# Patient Record
Sex: Male | Born: 1950
Health system: Southern US, Community
[De-identification: ages and names within clinical notes are randomized; demographics above are authoritative.]

## PROBLEM LIST (undated history)

## (undated) ENCOUNTER — Emergency Department (HOSPITAL_COMMUNITY): Payer: Medicare Other

## (undated) DIAGNOSIS — E785 Hyperlipidemia, unspecified: Secondary | ICD-10-CM

## (undated) DIAGNOSIS — I313 Pericardial effusion (noninflammatory): Secondary | ICD-10-CM

## (undated) DIAGNOSIS — I10 Essential (primary) hypertension: Secondary | ICD-10-CM

## (undated) DIAGNOSIS — I3139 Other pericardial effusion (noninflammatory): Secondary | ICD-10-CM

## (undated) DIAGNOSIS — J439 Emphysema, unspecified: Secondary | ICD-10-CM

## (undated) DIAGNOSIS — M722 Plantar fascial fibromatosis: Secondary | ICD-10-CM

## (undated) DIAGNOSIS — K056 Periodontal disease, unspecified: Secondary | ICD-10-CM

## (undated) HISTORY — DX: Essential (primary) hypertension: I10

## (undated) HISTORY — DX: Plantar fascial fibromatosis: M72.2

## (undated) HISTORY — DX: Hyperlipidemia, unspecified: E78.5

## (undated) HISTORY — PX: BACK SURGERY: SHX140

## (undated) HISTORY — DX: Periodontal disease, unspecified: K05.6

## (undated) HISTORY — DX: Pericardial effusion (noninflammatory): I31.3

## (undated) HISTORY — DX: Other pericardial effusion (noninflammatory): I31.39

---

## 2003-04-23 ENCOUNTER — Emergency Department (HOSPITAL_COMMUNITY): Admission: EM | Admit: 2003-04-23 | Discharge: 2003-04-23 | Payer: Self-pay | Admitting: Emergency Medicine

## 2003-04-23 ENCOUNTER — Encounter: Payer: Self-pay | Admitting: Emergency Medicine

## 2004-06-11 ENCOUNTER — Emergency Department (HOSPITAL_COMMUNITY): Admission: EM | Admit: 2004-06-11 | Discharge: 2004-06-11 | Payer: Self-pay | Admitting: Emergency Medicine

## 2006-03-09 ENCOUNTER — Encounter: Admission: RE | Admit: 2006-03-09 | Discharge: 2006-03-09 | Payer: Self-pay | Admitting: Family Medicine

## 2006-07-23 ENCOUNTER — Emergency Department (HOSPITAL_COMMUNITY): Admission: EM | Admit: 2006-07-23 | Discharge: 2006-07-24 | Payer: Self-pay | Admitting: Emergency Medicine

## 2006-08-14 ENCOUNTER — Encounter (INDEPENDENT_AMBULATORY_CARE_PROVIDER_SITE_OTHER): Payer: Self-pay | Admitting: Specialist

## 2006-08-14 ENCOUNTER — Ambulatory Visit (HOSPITAL_COMMUNITY): Admission: RE | Admit: 2006-08-14 | Discharge: 2006-08-14 | Payer: Self-pay | Admitting: Gastroenterology

## 2007-11-22 ENCOUNTER — Emergency Department (HOSPITAL_COMMUNITY): Admission: EM | Admit: 2007-11-22 | Discharge: 2007-11-22 | Payer: Self-pay | Admitting: Emergency Medicine

## 2008-04-12 ENCOUNTER — Emergency Department (HOSPITAL_COMMUNITY): Admission: EM | Admit: 2008-04-12 | Discharge: 2008-04-12 | Payer: Self-pay | Admitting: Emergency Medicine

## 2008-04-17 ENCOUNTER — Encounter: Admission: RE | Admit: 2008-04-17 | Discharge: 2008-04-17 | Payer: Self-pay | Admitting: Specialist

## 2008-04-20 ENCOUNTER — Encounter: Admission: RE | Admit: 2008-04-20 | Discharge: 2008-04-20 | Payer: Self-pay | Admitting: Specialist

## 2010-07-23 ENCOUNTER — Ambulatory Visit: Payer: Self-pay | Admitting: Cardiovascular Disease

## 2011-01-15 ENCOUNTER — Encounter: Payer: Self-pay | Admitting: Cardiovascular Disease

## 2011-01-15 DIAGNOSIS — E785 Hyperlipidemia, unspecified: Secondary | ICD-10-CM | POA: Insufficient documentation

## 2011-01-15 DIAGNOSIS — I10 Essential (primary) hypertension: Secondary | ICD-10-CM | POA: Insufficient documentation

## 2011-01-15 DIAGNOSIS — I313 Pericardial effusion (noninflammatory): Secondary | ICD-10-CM | POA: Insufficient documentation

## 2011-01-21 ENCOUNTER — Ambulatory Visit: Payer: Self-pay | Admitting: Cardiovascular Disease

## 2011-02-10 ENCOUNTER — Other Ambulatory Visit: Payer: Self-pay | Admitting: *Deleted

## 2011-02-10 DIAGNOSIS — E7800 Pure hypercholesterolemia, unspecified: Secondary | ICD-10-CM

## 2011-02-11 ENCOUNTER — Other Ambulatory Visit (INDEPENDENT_AMBULATORY_CARE_PROVIDER_SITE_OTHER): Payer: 59 | Admitting: *Deleted

## 2011-02-11 ENCOUNTER — Ambulatory Visit (INDEPENDENT_AMBULATORY_CARE_PROVIDER_SITE_OTHER): Payer: 59 | Admitting: Cardiovascular Disease

## 2011-02-11 ENCOUNTER — Encounter: Payer: Self-pay | Admitting: Cardiovascular Disease

## 2011-02-11 VITALS — BP 112/84 | HR 80 | Wt 227.0 lb

## 2011-02-11 DIAGNOSIS — E7800 Pure hypercholesterolemia, unspecified: Secondary | ICD-10-CM

## 2011-02-11 DIAGNOSIS — E785 Hyperlipidemia, unspecified: Secondary | ICD-10-CM

## 2011-02-11 DIAGNOSIS — I10 Essential (primary) hypertension: Secondary | ICD-10-CM

## 2011-02-11 LAB — BASIC METABOLIC PANEL
Calcium: 9.6 mg/dL (ref 8.4–10.5)
Creatinine, Ser: 0.9 mg/dL (ref 0.4–1.5)
GFR: 98.6 mL/min (ref >60.00)
Sodium: 139 mEq/L (ref 135–145)

## 2011-02-11 LAB — LIPID PANEL
HDL: 34.3 mg/dL — ABNORMAL LOW (ref >39.00)
Total CHOL/HDL Ratio: 4
Triglycerides: 77 mg/dL (ref 0.0–149.0)
VLDL: 15.4 mg/dL (ref 0.0–40.0)

## 2011-02-11 LAB — HEPATIC FUNCTION PANEL
Albumin: 4.4 g/dL (ref 3.5–5.2)
Alkaline Phosphatase: 67 U/L (ref 39–117)
Bilirubin, Direct: 0.1 mg/dL (ref 0.0–0.3)
Total Protein: 7.4 g/dL (ref 6.0–8.3)

## 2011-02-11 NOTE — Assessment & Plan Note (Signed)
His blood pressure is well controlled. We'll continue with his same medications. I've encouraged him to get out and exercise on a regular basis.

## 2011-02-11 NOTE — Assessment & Plan Note (Signed)
He had some questions about the Lovaza. I told him that the laser was a very pure fish oil ca[psule. He will continue to take it as long as his working as well as it is.

## 2011-02-11 NOTE — Patient Instructions (Signed)
priimary MD - Larita Fife

## 2011-02-11 NOTE — Progress Notes (Signed)
Larene Pickett Date of Birth  1951-10-02 [facility] / [facility] [address]     [address] [place], [place]  [address] [phone]  Fax  [phone]  History of Present Illness:  Demico is a middle-aged gentleman with a history of hypertension, hyperlipidemia, and a pericardial effusion. He's done very well since I last saw him. He's not had any episodes of chest pain or shortness of breath.  Current Outpatient Prescriptions on File Prior to Visit  Medication Sig Dispense Refill  . ezetimibe-simvastatin (VYTORIN) 10-40 MG per tablet Take 1 tablet by mouth at bedtime.        Marland Kitchen lisinopril-hydrochlorothiazide (PRINZIDE,ZESTORETIC) 20-25 MG per tablet Take 1 tablet by mouth daily.        Marland Kitchen omega-3 acid ethyl esters (LOVAZA) 1 G capsule Take 2 g by mouth 2 (two) times daily.          Allergies  Allergen Reactions  . Lipitor (Atorvastatin Calcium) Other (See Comments)    MUSCLE ACHES, GI UPSET  . Niaspan (Niacin (Antihyperlipidemic)) Itching and Other (See Comments)    FLUSHING  . Penicillins Hives    Past Medical History  Diagnosis Date  . Pericardial effusion   . HTN (hypertension)   . Hyperlipemia   . Plantar fasciitis   . Periodontal disease     Past Surgical History  Procedure Date  . Back surgery     L4,L5    History  Smoking status  . Current Everyday Smoker -- 40 years  . Types: Cigarettes  Smokeless tobacco  . Not on file    History  Alcohol Use  . Yes    OCCASIONAL BEER    Family History  Problem Relation Age of Onset  . Cancer Father   . Hypertension Father   . Coronary artery disease Mother   . Cancer Mother     GALLBLADDER  . Hypertension Brother     Reviw of Systems:  Reviewed in the HPI.  All other systems are negative.  Physical Exam: BP 140/88  Pulse 80  Wt 227 lb (102.967 kg) The patient is alert and oriented x 3.  The mood and affect are normal.  The skin is warm and dry.  Color is normal.  The  HEENT exam reveals that the sclera are nonicteric.  The mucous membranes are moist.  The carotids are 2+ without bruits.  There is no thyromegaly.  There is no JVD.  The lungs are clear.  The chest wall is non tender.  The heart exam reveals a regular rate with a normal S1 and S2.  There are no murmurs, gallops, or rubs.  The PMI is not displaced.   Abdominal exam reveals good bowel sounds.  There is no guarding or rebound.  There is no hepatosplenomegaly or tenderness.  There are no masses.  Exam of the legs reveal no clubbing, cyanosis, or edema.  The legs are without rashes.  The distal pulses are intact.  Cranial nerves II - XII are intact.  Motor and sensory functions are intact.  The gait is normal.  Assessment / Plan:

## 2011-02-12 ENCOUNTER — Telehealth: Payer: Self-pay | Admitting: *Deleted

## 2011-02-12 NOTE — Telephone Encounter (Signed)
msg left to phone back with questions, cholesterol results left.Alfonso Ramus RN

## 2011-03-14 NOTE — Op Note (Signed)
NAME:  ALYUS, MOFIELD NO.:  [id number]   MEDICAL RECORD NO.:  [id number]          PATIENT TYPE:  AMB   LOCATION:  ENDO                         FACILITY:  [facility]   PHYSICIAN:  Anselmo Rod, M.D.  DATE OF BIRTH:  06-Dec-1950   DATE OF PROCEDURE:  08/14/2006  DATE OF DISCHARGE:  08/14/2006                                 OPERATIVE REPORT   PROCEDURE PERFORMED:  Colonoscopy with snare polypectomy x1.   ENDOSCOPIST:  Anselmo Rod, M.D.   INSTRUMENT USED:  Olympus video colonoscope.   INDICATIONS FOR PROCEDURE:  A 60 year old white male with a family history  of colon cancer in his father undergoing a screening colonoscopy.  Rule out  colonic polyps, masses, etc.  He had some left lower quadrant pain.   PREPROCEDURE PREPARATION:  Informed consent was procured from the patient.  The patient fasted for four hours prior to the procedure and prepped with 20  Osmoprep pills the night of and 12 Osmoprep pills the morning of the  procedure.  Risks and benefits of the procedure including a 10% miss rate of  cancer and polyps were discussed with the patient as well.   PREPROCEDURE PHYSICAL:  VITAL SIGNS:  Stable vital signs.  NECK:  Supple.  CHEST:  Clear to auscultation.  CARDIOVASCULAR:  S1 and S2 regular.  ABDOMEN:  Soft with normal bowel sounds.   DESCRIPTION OF PROCEDURE:  The patient was placed in left lateral decubitus  position, sedated with 100 mcg of fentanyl and 10 mg of Versed in slow  incremental doses.  Once the patient was adequately sedated and maintained  on low flow oxygen and continuous cardiac monitoring, the Olympus video  colonoscope was advanced from the rectum to the cecum.  The appendiceal  orifice and ileocecal valve were clearly visualized and photographed.  A  small sessile polyp was snared from the distal right colon (snare  polypectomy x1).  The rest of the exam was unremarkable.  Small internal  hemorrhoids were seen on retroflexion.   There was no evidence of  diverticulosis.   IMPRESSION:  1. Small nonbleeding internal hemorrhoids.  2. Small sessile polyps snared from the distal right colon.  3. Otherwise normal exam to the terminal ileum.  4. No evidence of diverticulosis.   RECOMMENDATIONS:  1. Await pathology results.  2. Repeat colonoscopy depending on pathology results.  3. Avoid all nonsteroidals including aspirin for the next four weeks.  4. Outpatient follow-up as need arises in the future.  If the patient has      persistent left lower quadrant pain, he should return to the office in      the next two weeks for follow-up.      Anselmo Rod, M.D.  Electronically Signed     JNM/MEDQ  D:  08/14/2006  T:  08/16/2006  Job:  [id number]   cc:   Talmadge Coventry, M.D.

## 2011-06-11 ENCOUNTER — Other Ambulatory Visit: Payer: Self-pay | Admitting: Cardiovascular Disease

## 2011-06-11 MED ORDER — OMEGA-3-ACID ETHYL ESTERS 1 G PO CAPS: 2.0000 g | ORAL_CAPSULE | Freq: Two times a day (BID) | ORAL | Status: DC

## 2011-06-11 MED ORDER — EZETIMIBE-SIMVASTATIN 10-40 MG PO TABS: 1.0000 | ORAL_TABLET | Freq: Every day | ORAL | Status: DC

## 2011-08-26 ENCOUNTER — Ambulatory Visit (INDEPENDENT_AMBULATORY_CARE_PROVIDER_SITE_OTHER): Payer: 59 | Admitting: Cardiovascular Disease

## 2011-08-26 ENCOUNTER — Encounter: Payer: Self-pay | Admitting: Cardiovascular Disease

## 2011-08-26 ENCOUNTER — Other Ambulatory Visit (INDEPENDENT_AMBULATORY_CARE_PROVIDER_SITE_OTHER): Payer: BLUE CROSS/BLUE SHIELD | Admitting: *Deleted

## 2011-08-26 DIAGNOSIS — E785 Hyperlipidemia, unspecified: Secondary | ICD-10-CM

## 2011-08-26 DIAGNOSIS — J329 Chronic sinusitis, unspecified: Secondary | ICD-10-CM | POA: Insufficient documentation

## 2011-08-26 LAB — HEPATIC FUNCTION PANEL
ALT: 17 U/L (ref 0–53)
AST: 19 U/L (ref 0–37)
Alkaline Phosphatase: 59 U/L (ref 39–117)
Total Bilirubin: 0.4 mg/dL (ref 0.3–1.2)

## 2011-08-26 LAB — BASIC METABOLIC PANEL
BUN: 10 mg/dL (ref 6–23)
Chloride: 106 mEq/L (ref 96–112)
GFR: 105.54 mL/min (ref >60.00)
Potassium: 3.9 mEq/L (ref 3.5–5.1)
Sodium: 139 mEq/L (ref 135–145)

## 2011-08-26 LAB — LIPID PANEL
Cholesterol: 125 mg/dL (ref 0–200)
LDL Cholesterol: 76 mg/dL (ref 0–99)
VLDL: 13.6 mg/dL (ref 0.0–40.0)

## 2011-08-26 NOTE — Progress Notes (Signed)
Larene Pickett Date of Birth  [birth year] [facility] [address]    [address] [place], [place]  [address] [phone]  Fax  [phone]  History of Present Illness:  No cardiac complaints.  Complains of chronic sinus drainage.  He has broken his nose several times. He continues to smoke.  Has a history of hyperlipidemia. He's on Zetia.  Current Outpatient Prescriptions on File Prior to Visit  Medication Sig Dispense Refill  . ezetimibe-simvastatin (VYTORIN) 10-40 MG per tablet Take 1 tablet by mouth at bedtime.  30 tablet  3  . lisinopril-hydrochlorothiazide (PRINZIDE,ZESTORETIC) 20-25 MG per tablet Take 1 tablet by mouth daily.        Marland Kitchen omega-3 acid ethyl esters (LOVAZA) 1 G capsule Take 2 capsules (2 g total) by mouth 2 (two) times daily.  120 capsule  3    Allergies  Allergen Reactions  . Lipitor (Atorvastatin Calcium) Other (See Comments)    MUSCLE ACHES, GI UPSET  . Niaspan (Niacin (Antihyperlipidemic)) Itching and Other (See Comments)    FLUSHING  . Penicillins Hives    Past Medical History  Diagnosis Date  . Pericardial effusion   . HTN (hypertension)   . Hyperlipemia   . Plantar fasciitis   . Periodontal disease     Past Surgical History  Procedure Date  . Back surgery     L4,L5    History  Smoking status  . Current Everyday Smoker -- 40 years  . Types: Cigarettes  Smokeless tobacco  . Not on file    History  Alcohol Use  . Yes    OCCASIONAL BEER    Family History  Problem Relation Age of Onset  . Cancer Father   . Hypertension Father   . Coronary artery disease Mother   . Cancer Mother     GALLBLADDER  . Hypertension Brother     Reviw of Systems:  Reviewed in the HPI.  All other systems are negative.  Physical Exam:a BP 135/85  Pulse 72  Ht 6\' 2"  (1.88 m)  Wt 231 lb 12.8 oz (105.144 kg)  BMI 29.76 kg/m2 The patient is alert and oriented x 3.  The mood and affect are normal.   Skin: warm and dry.  Color is normal.     HEENT:   [place]/AT, normal carotids, no JVD  Lungs: clear   Heart: RR. No murmurs    Abdomen: soft, no HSM  Extremities:  No edema  Neuro:  Non focal, gait is normal     ECG:  Assessment / Plan:

## 2011-08-26 NOTE — Assessment & Plan Note (Signed)
Is to continue with the Zetia. We'll check his fasting lipid profile and hepatic profile again in 6 months we'll see him for an office visit.

## 2011-08-26 NOTE — Patient Instructions (Signed)
Your physician wants you to follow-up in:6 months You will receive a reminder letter in the mail two months in advance. If you don't receive a letter, please call our office to schedule the follow-up appointment.  Your physician recommends that you return for a FASTING lipid profile: 6 months   You have been referred to  Dr Suzanna Obey for chronic sinusitis

## 2011-08-26 NOTE — Assessment & Plan Note (Signed)
Has history of chronic sinusitis. He still smokes. I've asked him to stop smoking as this will help him. We'll refer him to Dr. Suzanna Obey.  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

## 2011-09-01 ENCOUNTER — Other Ambulatory Visit: Payer: Self-pay | Admitting: *Deleted

## 2011-09-01 MED ORDER — LISINOPRIL-HYDROCHLOROTHIAZIDE 20-25 MG PO TABS: 1.0000 | ORAL_TABLET | Freq: Every day | ORAL | Status: DC

## 2012-01-30 ENCOUNTER — Encounter: Payer: Self-pay | Admitting: Cardiovascular Disease

## 2012-03-06 ENCOUNTER — Encounter (HOSPITAL_COMMUNITY): Payer: Self-pay | Admitting: *Deleted

## 2012-03-06 ENCOUNTER — Emergency Department (HOSPITAL_COMMUNITY)
Admission: EM | Admit: 2012-03-06 | Discharge: 2012-03-06 | Disposition: A | Discharge status: Home / Self Care | Payer: BC Managed Care – PPO | Attending: Emergency Medicine | Admitting: Emergency Medicine

## 2012-03-06 ENCOUNTER — Emergency Department (HOSPITAL_COMMUNITY): Payer: BC Managed Care – PPO

## 2012-03-06 DIAGNOSIS — Z79899 Other long term (current) drug therapy: Secondary | ICD-10-CM | POA: Insufficient documentation

## 2012-03-06 DIAGNOSIS — I10 Essential (primary) hypertension: Secondary | ICD-10-CM | POA: Insufficient documentation

## 2012-03-06 DIAGNOSIS — K5900 Constipation, unspecified: Secondary | ICD-10-CM | POA: Insufficient documentation

## 2012-03-06 DIAGNOSIS — E785 Hyperlipidemia, unspecified: Secondary | ICD-10-CM | POA: Insufficient documentation

## 2012-03-06 DIAGNOSIS — R109 Unspecified abdominal pain: Secondary | ICD-10-CM | POA: Insufficient documentation

## 2012-03-06 LAB — POCT I-STAT, CHEM 8
BUN: 16 mg/dL (ref 6–23)
Calcium, Ion: 1.17 mmol/L (ref 1.12–1.32)
Chloride: 106 mEq/L (ref 96–112)
Creatinine, Ser: 0.8 mg/dL (ref 0.50–1.35)
Glucose, Bld: 91 mg/dL (ref 70–99)
TCO2: 24 mmol/L (ref 0–100)

## 2012-03-06 LAB — URINALYSIS, ROUTINE W REFLEX MICROSCOPIC
Bilirubin Urine: NEGATIVE
Glucose, UA: NEGATIVE mg/dL
Hgb urine dipstick: NEGATIVE
Ketones, ur: NEGATIVE mg/dL
Protein, ur: NEGATIVE mg/dL
pH: 7 (ref 5.0–8.0)

## 2012-03-06 MED ORDER — POLYETHYLENE GLYCOL 3350 17 G PO PACK: 17.0000 g | PACK | Freq: Two times a day (BID) | ORAL | Status: AC

## 2012-03-06 MED ORDER — KETOROLAC TROMETHAMINE 30 MG/ML IJ SOLN
30.0000 mg | Freq: Once | INTRAMUSCULAR | Status: AC
  Administered 2012-03-06: 30 mg via INTRAVENOUS
  Filled 2012-03-06: qty 1

## 2012-03-06 MED ORDER — HYDROMORPHONE HCL PF 1 MG/ML IJ SOLN
0.5000 mg | Freq: Once | INTRAMUSCULAR | Status: AC
  Administered 2012-03-06: 1 mg via INTRAVENOUS
  Filled 2012-03-06: qty 1

## 2012-03-06 MED ORDER — BISACODYL 10 MG RE SUPP: 10.0000 mg | RECTAL | Status: AC | PRN

## 2012-03-06 MED ORDER — HYDROMORPHONE HCL PF 1 MG/ML IJ SOLN
1.0000 mg | Freq: Once | INTRAMUSCULAR | Status: AC
  Administered 2012-03-06: 1 mg via INTRAVENOUS
  Filled 2012-03-06: qty 1

## 2012-03-06 MED ORDER — SODIUM CHLORIDE 0.9 % IV BOLUS (SEPSIS)
1000.0000 mL | Freq: Once | INTRAVENOUS | Status: AC
  Administered 2012-03-06: 1000 mL via INTRAVENOUS

## 2012-03-06 NOTE — ED Notes (Signed)
Patient with left flank pain.  Patient had a colonoscopy and the left flank pain was hurting then and it is the same side that is hurting.  Patient denies any urinary symptoms.  State that pain is worse when he takes a deep breath

## 2012-03-06 NOTE — Discharge Instructions (Signed)
Take miralax twice a day and use the enema once a day until you begin to move your bowel regularly.  Drink plenty of fluids and increase your fiber intake as much as possible.  Follow up with Dr. Loreta Ave if your pain has not started to improve by Tuesday or Wednesday.  You should return to the ER if your pain worsens or you develop associated fever, shortness of breath or blood in your stool.  Constipation in Adults Constipation is having fewer than 2 bowel movements per week. Usually, the stools are hard. As we grow older, constipation is more common. If you try to fix constipation with laxatives, the problem may get worse. This is because laxatives taken over a long period of time make the colon muscles weaker. A low-fiber diet, not taking in enough fluids, and taking some medicines may make these problems worse. MEDICATIONS THAT MAY CAUSE CONSTIPATION  Water pills (diuretics).   Calcium channel blockers (used to control blood pressure and for the heart).   Certain pain medicines (narcotics).   Anticholinergics.   Anti-inflammatory agents.   Antacids that contain aluminum.  DISEASES THAT CONTRIBUTE TO CONSTIPATION  Diabetes.   Parkinson's disease.   Dementia.   Stroke.   Depression.   Illnesses that cause problems with salt and water metabolism.  HOME CARE INSTRUCTIONS   Constipation is usually best cared for without medicines. Increasing dietary fiber and eating more fruits and vegetables is the best way to manage constipation.   Slowly increase fiber intake to 25 to 38 grams per day. Whole grains, fruits, vegetables, and legumes are good sources of fiber. A dietitian can further help you incorporate high-fiber foods into your diet.   Drink enough water and fluids to keep your urine clear or pale yellow.   A fiber supplement may be added to your diet if you cannot get enough fiber from foods.   Increasing your activities also helps improve regularity.   Suppositories, as  suggested by your caregiver, will also help. If you are using antacids, such as aluminum or calcium containing products, it will be helpful to switch to products containing magnesium if your caregiver says it is okay.   If you have been given a liquid injection (enema) today, this is only a temporary measure. It should not be relied on for treatment of longstanding (chronic) constipation.   Stronger measures, such as magnesium sulfate, should be avoided if possible. This may cause uncontrollable diarrhea. Using magnesium sulfate may not allow you time to make it to the bathroom.  SEEK IMMEDIATE MEDICAL CARE IF:   There is bright red blood in the stool.   The constipation stays for more than 4 days.   There is belly (abdominal) or rectal pain.   You do not seem to be getting better.   You have any questions or concerns.  MAKE SURE YOU:   Understand these instructions.   Will watch your condition.   Will get help right away if you are not doing well or get worse.  Document Released: 07/11/2004 Document Revised: 10/02/2011 Document Reviewed: 09/16/2011 [facility] Patient Information 2012 [place], [place].

## 2012-03-06 NOTE — ED Notes (Signed)
PT c/o left flank pain  St's onset after having colonoscopy.  Pt denies any blood in stools or hematuria.

## 2012-03-06 NOTE — ED Notes (Signed)
Pt st's he feels better, pt to CT via stretcher

## 2012-03-07 NOTE — ED Provider Notes (Signed)
History     CSN: [id number]  Arrival date & time 03/06/12  1955   First MD Initiated Contact with Patient 03/06/12 2003      Chief Complaint  Patient presents with  . Flank Pain    (Consider location/radiation/quality/duration/timing/severity/associated sxs/prior treatment) HPI History provided by pt.   Pt had a colonoscopy w/ polyp removal approx 1 month ago.  The next day he develop severe, sharp, intermittent pain in left side.  Followed up with Dr. Loreta Ave, she told him a polyp had been removed at that level of colon and prescribed him vicodin for symptomatic treatment.  Had some relief w/ vicodin.  Pt comes to ED today because pain acutely worsened last night.  Non-radiating.  No associated fever, cough/SOB, N/V/D, hematochezia/melena or urinary sx.  Most recent BM 2 days ago and they have been somewhat irregular.  No h/o kidney stone.  Denies trauma.     Past Medical History  Diagnosis Date  . Pericardial effusion   . HTN (hypertension)   . Hyperlipemia   . Plantar fasciitis   . Periodontal disease     Past Surgical History  Procedure Date  . Back surgery     L4,L5    Family History  Problem Relation Age of Onset  . Cancer Father   . Hypertension Father   . Coronary artery disease Mother   . Cancer Mother     GALLBLADDER  . Hypertension Brother     History  Substance Use Topics  . Smoking status: Current Everyday Smoker -- 40 years    Types: Cigarettes  . Smokeless tobacco: Not on file  . Alcohol Use: Yes     OCCASIONAL BEER      Review of Systems  All other systems reviewed and are negative.    Allergies  Lipitor; Niaspan; and Penicillins  Home Medications   Current Outpatient Rx  Name Route Sig Dispense Refill  . EZETIMIBE-SIMVASTATIN 10-40 MG PO TABS Oral Take 1 tablet by mouth at bedtime. 30 tablet 3  . LISINOPRIL-HYDROCHLOROTHIAZIDE 20-25 MG PO TABS Oral Take 1 tablet by mouth daily. 90 tablet 3  . OMEGA-3-ACID ETHYL ESTERS 1 G PO CAPS Oral  Take 2 capsules (2 g total) by mouth 2 (two) times daily. 120 capsule 3  . BISACODYL 10 MG RE SUPP Rectal Place 1 suppository (10 mg total) rectally as needed for constipation. 5 suppository 0  . POLYETHYLENE GLYCOL 3350 PO PACK Oral Take 17 g by mouth 2 (two) times daily. 10 each 0    BP 143/89  Pulse 65  Temp(Src) 97.7 F (36.5 C) (Oral)  Resp 18  SpO2 96%  Physical Exam  Nursing note and vitals reviewed. Constitutional: He is oriented to person, place, and time. He appears well-developed and well-nourished. No distress.       Uncomfortable appearing.  Restless.  Can't sit down.    HENT:  Head: Normocephalic and atraumatic.  Eyes:       Normal appearance  Neck: Normal range of motion.  Cardiovascular: Normal rate and regular rhythm.   Pulmonary/Chest: Effort normal and breath sounds normal. No respiratory distress.  Abdominal: Soft. Bowel sounds are normal. He exhibits no distension and no mass. There is no rebound and no guarding.       Pt points to pain being located just superior to left iliac crest on side.  Entire abd, including side non-tender.   Genitourinary:       No CVA tenderness  Musculoskeletal: Normal range of  motion.  Neurological: He is alert and oriented to person, place, and time.  Skin: Skin is warm and dry. No rash noted.  Psychiatric: He has a normal mood and affect. His behavior is normal.    ED Course  Procedures (including critical care time)   Labs Reviewed  URINALYSIS, ROUTINE W REFLEX MICROSCOPIC  POCT I-STAT, CHEM 8  LAB REPORT - SCANNED   Ct Abdomen Pelvis Wo Contrast  03/06/2012  *RADIOLOGY REPORT*  Clinical Data: Left flank pain.  CT ABDOMEN AND PELVIS WITHOUT CONTRAST  Technique:  Multidetector CT imaging of the abdomen and pelvis was performed following the standard protocol without intravenous contrast.  Comparison: None.  Findings: Dependent atelectasis is seen in the lung bases.  No pleural or pericardial effusion.  There are no renal  or ureteral stones and no hydronephrosis. Vascular calcifications about the right kidney are noted.  Urinary bladder is unremarkable.  Prostate gland is mildly prominent. Small cyst in the right kidney is noted.  The liver, gallbladder, spleen, adrenal glands and pancreas appear normal.  The stomach, small and large bowel and appendix appear normal.  There is no lymphadenopathy or fluid.  No focal bony abnormality with some lumbar degenerative disease noted.  IMPRESSION:  Negative for urinary tract stone.  No acute finding or finding to explain the patient's symptoms.  Original Report Authenticated By: Bernadene Bell. D'ALESSIO, M.D.     1. Constipation       MDM  Pt presents w/ one month severe, sharp, intermittent L side pain that started one day following colonoscopy.   No associated sx.  Has already followed up with GI and treated symptomatically.  On exam, pt appears very uncomfortable, afebrile, entire abd/side non-tender, no CVA ttp.  CT abd/pelvis neg for kidney stone or bowel perforation but shows large stool burden.  Pain may be secondary to gas/constipation.  Pt reports relief w/ IV toradol and dilaudid. D/c'd home w/ miralax and enema and referred back to Dr. Loreta Ave for persistent sx.  Return precautions discussed.       Arie Sabina [place], [place] 03/08/12 1944

## 2012-03-10 NOTE — ED Provider Notes (Signed)
Medical screening examination/treatment/procedure(s) were conducted as a shared visit with non-physician practitioner(s) and myself.  I personally evaluated the patient during the encounter Pt had colonoscopy one month ago, has had left flank pain on and off since then.  Pain worse last night.  Exam shows him to be a late-middle-aged man in mild-moderate distress with L flank pain.  CT showed stool with copious stool.  Advised stool softener, pain medications, followup with Dr. Loreta Ave, his gastroenterologist.   Carleene Cooper III, MD 03/10/12 2101

## 2012-04-07 ENCOUNTER — Other Ambulatory Visit: Payer: Self-pay | Admitting: *Deleted

## 2012-04-07 MED ORDER — EZETIMIBE-SIMVASTATIN 10-40 MG PO TABS: 1.0000 | ORAL_TABLET | Freq: Every day | ORAL | Status: DC

## 2012-04-07 NOTE — Telephone Encounter (Signed)
Fax Received. Refill Completed. Harold Brady (R.M.A)   

## 2013-07-27 ENCOUNTER — Encounter: Payer: Self-pay | Admitting: Cardiovascular Disease

## 2013-07-28 ENCOUNTER — Encounter: Payer: Self-pay | Admitting: Cardiovascular Disease

## 2013-08-04 ENCOUNTER — Encounter: Payer: Self-pay | Admitting: Cardiovascular Disease

## 2014-10-09 ENCOUNTER — Ambulatory Visit: Payer: Self-pay | Admitting: Medical

## 2014-10-30 ENCOUNTER — Ambulatory Visit (HOSPITAL_COMMUNITY)
Admission: RE | Admit: 2014-10-30 | Discharge: 2014-10-30 | Disposition: A | Discharge status: Home / Self Care | Payer: BLUE CROSS/BLUE SHIELD | Source: Ambulatory Visit | Attending: Physical Medicine and Rehabilitation | Admitting: Physical Medicine and Rehabilitation

## 2014-10-30 ENCOUNTER — Other Ambulatory Visit (HOSPITAL_COMMUNITY): Payer: Self-pay | Admitting: Physical Medicine and Rehabilitation

## 2014-10-30 DIAGNOSIS — Z01818 Encounter for other preprocedural examination: Secondary | ICD-10-CM | POA: Diagnosis not present

## 2014-10-30 DIAGNOSIS — M545 Low back pain: Secondary | ICD-10-CM

## 2015-04-03 ENCOUNTER — Ambulatory Visit (HOSPITAL_COMMUNITY)
Admission: RE | Admit: 2015-04-03 | Discharge: 2015-04-03 | Disposition: A | Discharge status: Home / Self Care | Payer: BLUE CROSS/BLUE SHIELD | Source: Ambulatory Visit | Attending: Family Medicine | Admitting: Family Medicine

## 2015-04-03 ENCOUNTER — Other Ambulatory Visit (HOSPITAL_COMMUNITY): Payer: Self-pay | Admitting: Family Medicine

## 2015-04-03 DIAGNOSIS — M542 Cervicalgia: Secondary | ICD-10-CM | POA: Insufficient documentation

## 2015-07-16 ENCOUNTER — Other Ambulatory Visit (HOSPITAL_COMMUNITY): Payer: Self-pay | Admitting: Family Medicine

## 2015-07-16 ENCOUNTER — Ambulatory Visit (HOSPITAL_COMMUNITY)
Admission: RE | Admit: 2015-07-16 | Discharge: 2015-07-16 | Disposition: A | Discharge status: Home / Self Care | Payer: BLUE CROSS/BLUE SHIELD | Source: Ambulatory Visit | Attending: Family Medicine | Admitting: Family Medicine

## 2015-07-16 DIAGNOSIS — M25562 Pain in left knee: Secondary | ICD-10-CM | POA: Diagnosis present

## 2015-07-16 DIAGNOSIS — M25462 Effusion, left knee: Secondary | ICD-10-CM | POA: Diagnosis not present

## 2015-07-16 DIAGNOSIS — R937 Abnormal findings on diagnostic imaging of other parts of musculoskeletal system: Secondary | ICD-10-CM | POA: Insufficient documentation

## 2015-08-22 DIAGNOSIS — Z720 Tobacco use: Secondary | ICD-10-CM | POA: Insufficient documentation

## 2015-08-22 DIAGNOSIS — R0602 Shortness of breath: Secondary | ICD-10-CM | POA: Insufficient documentation

## 2015-08-22 DIAGNOSIS — R609 Edema, unspecified: Secondary | ICD-10-CM | POA: Insufficient documentation

## 2016-09-08 NOTE — Progress Notes (Signed)
Cardiology Office Note   Date:  09/08/2016   ID:  Harold Brady, DOB Jan 26, 1951, MRN [id number]  PCP:  Trinna PostKOBERLEIN, JUNELL CAROL, MD  Cardiologist:   Charlton HawsPeter Claudetta Sallie, MD   No chief complaint on file.     History of Present Illness: Harold Brady is a 65 y.o. 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 Still smoking Wearing a magnet behind his  Ear to try to quit  Active likes to hunt no chest pain palpitations or dysonea mild cough     Past Medical History:  Diagnosis Date  . HTN (hypertension)   . Hyperlipemia   . Pericardial effusion   . Periodontal disease   . Plantar fasciitis     Past Surgical History:  Procedure Laterality Date  . BACK SURGERY     L4,L5     Current Outpatient Prescriptions  Medication Sig Dispense Refill  . ezetimibe-simvastatin (VYTORIN) 10-40 MG per tablet Take 1 tablet by mouth at bedtime. 30 tablet 4  . lisinopril-hydrochlorothiazide (PRINZIDE,ZESTORETIC) 20-25 MG per tablet Take 1 tablet by mouth daily. 90 tablet 3  . omega-3 acid ethyl esters (LOVAZA) 1 G capsule Take 2 capsules (2 g total) by mouth 2 (two) times daily. 120 capsule 3   No current facility-administered medications for this visit.     Allergies:   Lipitor [atorvastatin calcium]; Niaspan [niacin er]; and Penicillins    Social History:  The patient  reports that he has been smoking Cigarettes.  He has smoked for the past 40.00 years. He does not have any smokeless tobacco history on file. He reports that he drinks alcohol.   Family History:  The patient's family history includes Cancer in his father and  mother; Coronary artery disease in his mother; Hypertension in his brother and father.    ROS:  Please see the history of present illness.   Otherwise, review of systems are positive for none.   All other systems are reviewed and negative.    PHYSICAL EXAM: VS:  There were no vitals taken for this visit. , BMI There is no height or weight on file to calculate BMI. Affect appropriate Bronchitic white male  HEENT: normal Neck supple with no adenopathy JVP normal no bruits no thyromegaly Lungs clear with no wheezing and good diaphragmatic motion Heart:  S1/S2 no murmur, no rub, gallop or click PMI normal Abdomen: benighn, BS positve, no tenderness, no AAA no bruit.  No HSM or HJR Distal pulses intact with no bruits Plus one bilateral edema varicose veins  Neuro non-focal Skin warm and dry No muscular weakness    EKG:  2012 SR rate 69 LAFB normal ST segments  09/09/16  SR LAFB otherwise normal    Recent Labs: No results found for requested labs within last 8760 hours.    Lipid Panel    Component Value Date/Time   CHOL 125 08/26/2011 1116   TRIG 68.0 08/26/2011 1116   HDL 35.70 (L) 08/26/2011 1116   CHOLHDL 4 08/26/2011 1116   VLDL 13.6 08/26/2011 1116   LDLCALC 76 08/26/2011 1116  Wt Readings from Last 3 Encounters:  08/26/11 105.1 kg (231 lb 12.8 oz)  02/11/11 (!) 103 kg (227 lb)      Other studies Reviewed: Additional studies/ records that were reviewed today include: Notes Dr Elease HashimotoNahser 2007 Primary care notes CT scan 08/2015 [facility] .    ASSESSMENT AND PLAN:  1.  CAD by CT normal ETT February will try to get records continue ASA  2. HTN  Well controlled.  Continue current medications and low sodium Dash type diet.   3. Chol continue statin at goal 4. Smoking:  CT with no asbestosis or cancer trying magnets now counseled f/u primary 5. Varicose Veins referred to vein clinic would benefit from scleroRx   Current medicines are reviewed at length with  the patient today.  The patient does not have concerns regarding medicines.  The following changes have been made:  no change  Labs/ tests ordered today include: None  No orders of the defined types were placed in this encounter.    Disposition:   FU with me in a year      Signed, Charlton Hawseter Coben Godshall, MD  09/08/2016 10:36 AM    [facility] [address], [place], [place]  [address] Phone: [phone]; Fax: [phone]

## 2016-09-09 ENCOUNTER — Encounter: Payer: Self-pay | Admitting: Cardiovascular Disease

## 2016-09-09 ENCOUNTER — Ambulatory Visit (INDEPENDENT_AMBULATORY_CARE_PROVIDER_SITE_OTHER): Payer: BLUE CROSS/BLUE SHIELD | Admitting: Cardiovascular Disease

## 2016-09-09 VITALS — BP 126/84 | HR 82 | Wt 233.0 lb

## 2016-09-09 DIAGNOSIS — I10 Essential (primary) hypertension: Secondary | ICD-10-CM | POA: Diagnosis not present

## 2016-09-09 MED ORDER — OMEGA-3-ACID ETHYL ESTERS 1 G PO CAPS: 2.0000 g | ORAL_CAPSULE | Freq: Two times a day (BID) | ORAL | 3 refills | Status: DC

## 2016-09-09 NOTE — Patient Instructions (Signed)

## 2017-03-27 ENCOUNTER — Other Ambulatory Visit: Payer: Self-pay | Admitting: Cardiovascular Disease

## 2017-07-27 NOTE — Progress Notes (Signed)
Cardiology Office Note   Date:  08/03/2017   ID:  Harold Brady, DOB 10/22/1951, MRN [id number]  PCP:  [facility]  Cardiologist:   Charlton Haws, MD   No chief complaint on file.     History of Present Illness: Harold Brady is a 66 y.o. 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 Still smoking Previously tried "magnets" to try to quit Active likes to hunt no chest pain palpitations or dysonea mild cough   Interval history : He will not get flu shot got sick with one before Has heaviness and tingling in legs especially When dependent all day Some varicosities No pain in calves with ambulation   Past Medical History:  Diagnosis Date  . HTN (hypertension)   . Hyperlipemia   . Pericardial effusion   . Periodontal disease   . Plantar fasciitis     Past Surgical History:  Procedure Laterality Date  . BACK SURGERY     L4,L5     Current Outpatient Prescriptions  Medication Sig Dispense Refill  . ezetimibe-simvastatin (VYTORIN) 10-40 MG per tablet Take 1 tablet by mouth at bedtime. 30 tablet 4  . irbesartan (AVAPRO) 150 MG tablet Take 150 mg by mouth daily.     Marland Kitchen omega-3 acid ethyl esters (LOVAZA) 1 g capsule TAKE (2) CAPSULES BY MOUTH TWICE DAILY. 120 capsule 3   No current facility-administered medications for this visit.     Allergies:   Lipitor [atorvastatin calcium]; Niaspan [niacin er]; and Penicillins    Social History:  The patient  reports that he has been smoking Cigarettes.  He started smoking about 51 years ago. He has a 40.00 pack-year smoking history. He has never used smokeless  tobacco. He reports that he drinks alcohol.   Family History:  The patient's family history includes Cancer in his father and mother; Coronary artery disease in his mother; Hypertension in his brother and father.    ROS:  Please see the history of present illness.   Otherwise, review of systems are positive for none.   All other systems are reviewed and negative.    PHYSICAL EXAM: VS:  BP 136/88   Pulse 73   Ht  (1.88 m)   Wt 237 lb (107.5 kg)   SpO2 95%   BMI 30.43 kg/m  , BMI Body mass index is 30.43 kg/m. Affect appropriate Healthy:  appears stated age HEENT: normal Neck supple with no adenopathy JVP normal no bruits no thyromegaly Lungs clear with no wheezing and good diaphragmatic motion Heart:  S1/S2 no murmur, no rub, gallop or click PMI normal Abdomen: benighn, BS positve, no tenderness, no AAA no bruit.  No HSM or HJR Distal pulses intact with no bruits No edema Neuro non-focal Skin warm and dry No muscular weakness Varicose veins bilaterally     EKG:  2012 SR rate 69 LAFB normal ST segments  09/09/16  SR LAFB otherwise normal    Recent Labs: No results found for requested labs within last 8760 hours.    Lipid Panel    Component Value  Date/Time   CHOL 125 08/26/2011 1116   TRIG 68.0 08/26/2011 1116   HDL 35.70 (L) 08/26/2011 1116   CHOLHDL 4 08/26/2011 1116   VLDL 13.6 08/26/2011 1116   LDLCALC 76 08/26/2011 1116      Wt Readings from Last 3 Encounters:  08/03/17 237 lb (107.5 kg)  09/09/16 233 lb (105.7 kg)  08/26/11 231 lb 12.8 oz (105.1 kg)      Other studies Reviewed: Additional studies/ records that were reviewed today include: Notes Dr Elease Hashimoto 2007 Primary care notes CT scan 08/2015 [place] .    ASSESSMENT AND PLAN:  1.  CAD by CT normal ETT 08/2015 [place]  continue ASA  2. HTN  Well controlled.  Continue current medications and low sodium Dash type diet.   3. Chol continue statin at goal 4. Smoking:  CT with no  asbestosis or cancer trying magnets now counseled f/u primary 5. Varicose Veins referred to vein clinic would benefit from scleroRx will check ABI's for his Leg pain but suspect it is more neuropathic and related to venous engorgement from varicosities   Current medicines are reviewed at length with the patient today.  The patient does not have concerns regarding medicines.  The following changes have been made:  no change  Labs/ tests ordered today include: None   Orders Placed This Encounter  Procedures  . US ARTERIAL SEG MULTIPLE LE (ABI, SEGMENTAL PRESSURES, PVR'S)     Disposition:   FU with me in a year      Signed, Charlton Haws, MD  08/03/2017 9:31 AM    [facility] [address] [place], [place], [place]  [address] Phone: [phone]; Fax: [phone]

## 2017-08-03 ENCOUNTER — Ambulatory Visit (INDEPENDENT_AMBULATORY_CARE_PROVIDER_SITE_OTHER): Payer: BLUE CROSS/BLUE SHIELD | Admitting: Cardiovascular Disease

## 2017-08-03 ENCOUNTER — Encounter: Payer: Self-pay | Admitting: Cardiovascular Disease

## 2017-08-03 VITALS — BP 136/88 | HR 73 | Ht 74.0 in | Wt 237.0 lb

## 2017-08-03 DIAGNOSIS — I739 Peripheral vascular disease, unspecified: Secondary | ICD-10-CM

## 2017-08-03 NOTE — Patient Instructions (Signed)
Medication Instructions:  Your physician recommends that you continue on your current medications as directed. Please refer to the Current Medication list given to you today.   Labwork: NONE  Testing/Procedures: Your physician has requested that you have an ankle brachial index (ABI). During this test an ultrasound and blood pressure cuff are used to evaluate the arteries that supply the arms and legs with blood. Allow thirty minutes for this exam. There are no restrictions or special instructions.    Follow-Up: Your physician wants you to follow-up in: 1 YEAR.  You will receive a reminder letter in the mail two months in advance. If you don't receive a letter, please call our office to schedule the follow-up appointment.   Any Other Special Instructions Will Be Listed Below (If Applicable).     If you need a refill on your cardiac medications before your next appointment, please call your pharmacy.   

## 2017-09-02 ENCOUNTER — Ambulatory Visit (HOSPITAL_COMMUNITY)
Admission: RE | Admit: 2017-09-02 | Discharge: 2017-09-02 | Disposition: A | Discharge status: Home / Self Care | Payer: BLUE CROSS/BLUE SHIELD | Source: Ambulatory Visit | Attending: Cardiovascular Disease | Admitting: Cardiovascular Disease

## 2017-09-02 DIAGNOSIS — I739 Peripheral vascular disease, unspecified: Secondary | ICD-10-CM

## 2017-09-02 DIAGNOSIS — I70203 Unspecified atherosclerosis of native arteries of extremities, bilateral legs: Secondary | ICD-10-CM | POA: Insufficient documentation

## 2017-09-04 ENCOUNTER — Other Ambulatory Visit: Payer: Self-pay

## 2017-09-04 ENCOUNTER — Telehealth: Payer: Self-pay

## 2017-09-04 NOTE — Telephone Encounter (Signed)
Called pt., no answer. Left message for pt. To return call.  

## 2017-12-21 ENCOUNTER — Other Ambulatory Visit: Payer: Self-pay | Admitting: Cardiovascular Disease

## 2018-06-09 ENCOUNTER — Other Ambulatory Visit: Payer: Self-pay | Admitting: Cardiovascular Disease

## 2018-08-17 ENCOUNTER — Other Ambulatory Visit: Payer: Self-pay

## 2018-08-17 DIAGNOSIS — Z8679 Personal history of other diseases of the circulatory system: Secondary | ICD-10-CM

## 2018-09-06 ENCOUNTER — Ambulatory Visit (HOSPITAL_COMMUNITY)
Admission: RE | Admit: 2018-09-06 | Discharge: 2018-09-06 | Disposition: A | Discharge status: Home / Self Care | Payer: Medicare Other | Source: Ambulatory Visit | Attending: Cardiovascular Disease | Admitting: Cardiovascular Disease

## 2018-09-06 DIAGNOSIS — Z8679 Personal history of other diseases of the circulatory system: Secondary | ICD-10-CM | POA: Diagnosis present

## 2018-09-07 ENCOUNTER — Other Ambulatory Visit: Payer: Self-pay

## 2018-09-07 DIAGNOSIS — I83893 Varicose veins of bilateral lower extremities with other complications: Secondary | ICD-10-CM

## 2018-10-21 ENCOUNTER — Other Ambulatory Visit: Payer: Self-pay | Admitting: Cardiovascular Disease

## 2018-11-17 ENCOUNTER — Ambulatory Visit: Payer: Medicare Other | Admitting: Vascular Surgery

## 2018-11-17 ENCOUNTER — Encounter: Payer: BLUE CROSS/BLUE SHIELD | Admitting: Vascular Surgery

## 2018-11-17 ENCOUNTER — Other Ambulatory Visit: Payer: Self-pay

## 2018-11-17 ENCOUNTER — Ambulatory Visit (HOSPITAL_COMMUNITY)
Admission: RE | Admit: 2018-11-17 | Discharge: 2018-11-17 | Disposition: A | Discharge status: Home / Self Care | Payer: Medicare Other | Source: Ambulatory Visit | Attending: Vascular Surgery | Admitting: Vascular Surgery

## 2018-11-17 ENCOUNTER — Encounter: Payer: Self-pay | Admitting: Vascular Surgery

## 2018-11-17 VITALS — BP 123/84 | HR 66 | Temp 97.4°F | Resp 18 | Ht 74.0 in | Wt 230.0 lb

## 2018-11-17 DIAGNOSIS — I83811 Varicose veins of right lower extremities with pain: Secondary | ICD-10-CM

## 2018-11-17 DIAGNOSIS — I83893 Varicose veins of bilateral lower extremities with other complications: Secondary | ICD-10-CM | POA: Diagnosis not present

## 2018-11-17 NOTE — Progress Notes (Signed)
Referring Physician: Dr Jeoffrey MassedGildon  Patient name: Harold Brady MRN: [id number] DOB: 06/05/1951 Sex: male  REASON FOR CONSULT: Right leg varicose veins  HPI: Harold Brady is a 68 y.o. male, who has developed varicose veins in his right leg over the last several years.  He has always work in occupation which is kept him on his feet all day long.  He denies prior history of DVT.  He denies family history of varicose veins.  He has not worn compression stockings in the past.  Other medical problems include hypertension hyperlipidemia both of which are currently stable.  Past Medical History:  Diagnosis Date  . HTN (hypertension)   . Hyperlipemia   . Pericardial effusion   . Periodontal disease   . Plantar fasciitis    Past Surgical History:  Procedure Laterality Date  . BACK SURGERY     L4,L5    Family History  Problem Relation Age of Onset  . Cancer Father   . Hypertension Father   . Coronary artery disease Mother   . Cancer Mother        GALLBLADDER  . Hypertension Brother     SOCIAL HISTORY: Social History   Socioeconomic History  . Marital status: Married    Spouse name: Not on file  . Number of children: Not on file  . Years of education: Not on file  . Highest education level: Not on file  Occupational History  . Not on file  Social Needs  . Financial resource strain: Not on file  . Food insecurity:    Worry: Not on file    Inability: Not on file  . Transportation needs:    Medical: Not on file    Non-medical: Not on file  Tobacco Use  . Smoking status: Current Every Day Smoker    Packs/day: 1.00    Years: 40.00    Pack years: 40.00    Types: Cigarettes    Start date: 09/09/1965  . Smokeless tobacco: Never Used  Substance and Sexual Activity  . Alcohol use: Yes    Comment: OCCASIONAL BEER  . Drug use: Not on file  . Sexual activity: Not on file  Lifestyle  . Physical activity:    Days per week: Not on file    Minutes per session: Not on  file  . Stress: Not on file  Relationships  . Social connections:    Talks on phone: Not on file    Gets together: Not on file    Attends religious service: Not on file    Active member of club or organization: Not on file    Attends meetings of clubs or organizations: Not on file    Relationship status: Not on file  . Intimate partner violence:    Fear of current or ex partner: Not on file    Emotionally abused: Not on file    Physically abused: Not on file    Forced sexual activity: Not on file  Other Topics Concern  . Not on file  Social History Narrative  . Not on file    Allergies  Allergen Reactions  . Lipitor [Atorvastatin Calcium] Other (See Comments)    MUSCLE ACHES, GI UPSET  . Niaspan [Niacin Er] Itching and Other (See Comments)    FLUSHING  . Penicillins Hives    Current Outpatient Medications  Medication Sig Dispense Refill  . ezetimibe-simvastatin (VYTORIN) 10-40 MG per tablet Take 1 tablet by mouth at bedtime. 30 tablet 4  .  irbesartan (AVAPRO) 150 MG tablet Take 150 mg by mouth daily.     Marland Kitchen. omega-3 acid ethyl esters (LOVAZA) 1 g capsule TAKE (2) CAPSULES BY MOUTH TWICE DAILY. 120 capsule 0   No current facility-administered medications for this visit.     ROS:   General:  No weight loss, Fever, chills  HEENT: No recent headaches, no nasal bleeding, no visual changes, no sore throat  Neurologic: No recent symptoms of stroke or mini- stroke. No recent episodes of slurred speech, or temporary blindness.  Cardiac: No recent episodes of chest pain/pressure, no shortness of breath at rest.  No shortness of breath with exertion.  Denies history of atrial fibrillation or irregular heartbeat  Vascular: No history of rest pain in feet.  No history of claudication.  No history of non-healing ulcer, No history of DVT   Pulmonary: No home oxygen, no productive cough, no hemoptysis,  No asthma or wheezing  Musculoskeletal:  [ ]  Arthritis, [ ]  Low back pain,  [ ]   Joint pain  Hematologic:No history of hypercoagulable state.  No history of easy bleeding.  No history of anemia  Gastrointestinal: No hematochezia or melena,  No gastroesophageal reflux, no trouble swallowing  Urinary: [ ]  chronic Kidney disease, [ ]  on HD - [ ]  MWF or [ ]  TTHS, [ ]  Burning with urination, [ ]  Frequent urination, [ ]  Difficulty urinating;   Skin: No rashes  Psychological: No history of anxiety,  No history of depression   Physical Examination  Vitals:   11/17/18 1420  BP: 123/84  Pulse: 66  Resp: 18  Temp: (!) 97.4 F (36.3 C)  TempSrc: Oral  SpO2: 99%  Weight: 230 lb (104.3 kg)  Height: 6\' 2"  (1.88 m)    Body mass index is 29.53 kg/m.  General:  Alert and oriented, no acute distress HEENT: Normal Neck: No bruit or JVD Pulmonary: Clear to auscultation bilaterally Cardiac: Regular Rate and Rhythm without murmur Abdomen: Soft, non-tender, non-distended, no mass, no scars Skin: No rash Extremity Pulses:  2+ radial, brachial, femoral, dorsalis pedis, posterior tibial pulses bilaterally Musculoskeletal: No deformity or edema  Neurologic: Upper and lower extremity motor 5/5 and symmetric     DATA:  She had a lower extremity venous duplex scan today of both lower extremities.  This showed no evidence of reflux bilaterally.  Right greater saphenous was about 3-1/2 to 4 mm diameter.  Left was 4 to 5 mm diameter.  I performed a SonoSite ultrasound exam of the patient's right leg at the bedside.  This again confirmed that the right greater saphenous was fairly small in diameter up to about 3 mm in diameter on my exam.  ASSESSMENT: Right lower extremity varicose veins essentially asymptomatic other than appearance.  Patient denies any fullness heaviness achiness wound healing problems or skin changes.  He did not have evidence of reflux on ultrasound today.   PLAN: I discussed with the patient wearing lower extremity compression stockings for prevention of  further worsening of his varicose veins.  I also discussed with him pathophysiology of varicose veins and venous reflux disease.  He currently does not have venous disease with significant enough reflux to warrant laser ablation of his varicose veins.  We did talk about possibly isolated stab avulsions.  At this point we are going to opt for conservative management with compression therapy alone.  I discussed with him that if he develops symptoms related to his veins we could always revisit whether or not a  procedure would be in his interest.  However, currently most likely this would not be approved by his insurance due to lack of reflux.  He understands and will call us back if symptoms worsen over time.   Fabienne Bruns, MD [facility] Office: [phone] Pager: [phone]

## 2018-12-23 ENCOUNTER — Encounter: Payer: Self-pay | Admitting: Physician Assistant

## 2019-01-25 ENCOUNTER — Other Ambulatory Visit: Payer: Self-pay

## 2019-01-25 NOTE — Patient Outreach (Signed)
[facility] Customer service manager [facility]) Care Management  01/25/2019  JEP STJULIAN 07-31-1951 [id number]   Medication Adherence call to Mr. Dionta Sylte Hippa Identifiers Verify spoke with patient he is still taking 1 tablet daily he said he forgot to order it and if we can help him order it patient would like a 90 days supply. Spoke with [facility] they said patient has enough to fill it for 90 days and will have it ready for patient to pick up. Mr. Birkhead is showing past due under [employer].   Lillia Abed CPhT Pharmacy Technician [facility] Care Management Direct Dial [phone]  Fax [phone] [email or web address]

## 2019-01-29 ENCOUNTER — Other Ambulatory Visit: Payer: Self-pay | Admitting: Cardiovascular Disease

## 2019-05-19 ENCOUNTER — Other Ambulatory Visit: Payer: Self-pay

## 2019-05-19 NOTE — Patient Outreach (Signed)
[place] [facility]) Care Management  05/19/2019  VERDUN RACKLEY 1951-10-17 [id number]   Medication Adherence call to Mr. Arber Wiemers HIPPA Compliant Voice message left with a call back number. Mr. Duhe is showing past due on Ezetimibe/Simvastatin 10/20 mg under [place].   [place] Management Direct Dial [phone]  Fax [phone] [email or web address]

## 2019-05-31 ENCOUNTER — Other Ambulatory Visit: Payer: Self-pay

## 2019-05-31 NOTE — Patient Outreach (Signed)
[facility]) Care Management  05/31/2019  Harold Brady 02/24/1951 [id number]   Medication Adherence call to Mr. Harold Brady Hippa Identifiers Verify spoke with patient he is past due on Olmesartan 40 mg and Ezetimible/Simvastatim 10/20 patient explain he is on the [facility] and has already pay his deductible two times and is not willing to pay any more patient said if he has to go with out medication he will.Mr. Harold Brady is showing past due under [place].   [place] Management Direct Dial [phone]  Fax [phone] [email or web address]

## 2019-06-21 DIAGNOSIS — E7849 Other hyperlipidemia: Secondary | ICD-10-CM | POA: Diagnosis not present

## 2019-06-21 DIAGNOSIS — I10 Essential (primary) hypertension: Secondary | ICD-10-CM | POA: Diagnosis not present

## 2019-06-21 DIAGNOSIS — Z719 Counseling, unspecified: Secondary | ICD-10-CM | POA: Diagnosis not present

## 2019-06-21 DIAGNOSIS — Z1389 Encounter for screening for other disorder: Secondary | ICD-10-CM | POA: Diagnosis not present

## 2019-06-21 DIAGNOSIS — R7309 Other abnormal glucose: Secondary | ICD-10-CM | POA: Diagnosis not present

## 2019-10-06 ENCOUNTER — Other Ambulatory Visit: Payer: Self-pay

## 2019-10-06 NOTE — Patient Outreach (Signed)
[place] [facility]) Care Management  10/06/2019  ANTIONIO NEGRON 09-01-1951 [id number]   Medication Adherence call to Mr. Oluwanifemi Susman HIPPA Compliant Voice message left with a call back number. Mr. Arora is showing past due on Rosuvastatin 10 mg and Olmesratn 40 mg under [place].  [place] Management Direct Dial [phone]  Fax [phone] [email or web address]

## 2019-10-27 DIAGNOSIS — I10 Essential (primary) hypertension: Secondary | ICD-10-CM | POA: Diagnosis not present

## 2019-10-27 DIAGNOSIS — E7849 Other hyperlipidemia: Secondary | ICD-10-CM | POA: Diagnosis not present

## 2019-11-04 ENCOUNTER — Ambulatory Visit: Payer: Medicare Other | Admitting: Cardiovascular Disease

## 2019-11-17 DIAGNOSIS — H4912 Fourth [trochlear] nerve palsy, left eye: Secondary | ICD-10-CM | POA: Diagnosis not present

## 2019-11-27 DIAGNOSIS — E7849 Other hyperlipidemia: Secondary | ICD-10-CM | POA: Diagnosis not present

## 2019-11-27 DIAGNOSIS — I10 Essential (primary) hypertension: Secondary | ICD-10-CM | POA: Diagnosis not present

## 2019-12-06 DIAGNOSIS — Z0000 Encounter for general adult medical examination without abnormal findings: Secondary | ICD-10-CM | POA: Diagnosis not present

## 2019-12-06 DIAGNOSIS — Z1389 Encounter for screening for other disorder: Secondary | ICD-10-CM | POA: Diagnosis not present

## 2019-12-06 DIAGNOSIS — R7309 Other abnormal glucose: Secondary | ICD-10-CM | POA: Diagnosis not present

## 2019-12-06 DIAGNOSIS — H532 Diplopia: Secondary | ICD-10-CM | POA: Diagnosis not present

## 2019-12-06 DIAGNOSIS — S0990XD Unspecified injury of head, subsequent encounter: Secondary | ICD-10-CM | POA: Diagnosis not present

## 2019-12-06 DIAGNOSIS — E785 Hyperlipidemia, unspecified: Secondary | ICD-10-CM | POA: Diagnosis not present

## 2019-12-06 DIAGNOSIS — I10 Essential (primary) hypertension: Secondary | ICD-10-CM | POA: Diagnosis not present

## 2019-12-06 NOTE — Progress Notes (Signed)
Cardiology Office Note   Date:  12/14/2019   ID:  Harold Brady, DOB 08-07-1951, MRN [id number]  PCP:  [facility]  Cardiologist:   Charlton Haws, MD   No chief complaint on file.     History of Present Illness: Harold Brady is a 69 y.o. 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 HTN (hypertension)   . Hyperlipemia   . Pericardial effusion   . Periodontal disease   . Plantar fasciitis     Past Surgical History:  Procedure Laterality Date  . BACK SURGERY     L4,L5     Current Outpatient Medications  Medication Sig Dispense Refill  . ezetimibe-simvastatin (VYTORIN) 10-40 MG per tablet Take 1 tablet by mouth at bedtime. 30 tablet 4  . irbesartan (AVAPRO) 150 MG tablet Take 150 mg by mouth daily.     Marland Kitchen omega-3 acid ethyl esters (LOVAZA) 1 g capsule Take 2 capsules (2 g total) by mouth 2 (two) times daily. Please make annual appt with Dr. Elease Hashimoto for future refills. Thank you 120 capsule 2   No current facility-administered medications for this visit.    Allergies:   Lipitor  [atorvastatin calcium], Niaspan [niacin er], and Penicillins    Social History:  The patient  reports that he has been smoking cigarettes. He started smoking about 54 years ago. He has a 40.00 pack-year smoking history. He has never used smokeless tobacco. He reports current alcohol use.   Family History:  The patient's family history includes Cancer in his father and mother; Coronary artery disease in his mother; Hypertension in his brother and father.    ROS:  Please see the history of present illness.   Otherwise, review of systems are positive for none.   All other systems are reviewed and negative.    PHYSICAL EXAM: VS:  BP 110/72   Pulse 76   Ht 6\' 2"  (1.88 m)   Wt 238 lb 6.4 oz (108.1 kg)   SpO2 98%   BMI 30.61 kg/m  , BMI Body mass index is 30.61 kg/m. Affect appropriate Healthy:  appears stated age HEENT: normal Neck supple with no adenopathy JVP normal no bruits no thyromegaly Lungs clear with no wheezing and good diaphragmatic motion Heart:  S1/S2 no murmur, no rub, gallop or click PMI normal Abdomen: benighn, BS positve, no tenderness, no AAA no bruit.  No HSM or HJR Distal pulses intact with no bruits No edema Neuro non-focal Skin warm and dry No muscular weakness Varicose veins bilaterally right >  left     EKG:  2012 SR rate 69 LAFB normal ST segments  09/09/16  SR LAFB otherwise normal 12/14/19 SR LAD normal    Recent Labs: No results found for requested labs within last 8760 hours.    Lipid Panel    Component Value Date/Time   CHOL 125 08/26/2011 1116   TRIG 68.0 08/26/2011 1116   HDL 35.70 (L) 08/26/2011 1116   CHOLHDL 4 08/26/2011 1116   VLDL 13.6 08/26/2011 1116   LDLCALC 76 08/26/2011 1116      Wt Readings from Last 3 Encounters:  12/14/19 238 lb 6.4 oz (108.1 kg)  11/17/18 230 lb (104.3 kg)  08/03/17 237 lb (107.5 kg)      Other studies Reviewed: Additional studies/ records that were reviewed today include: Notes Dr Acie Fredrickson 2007  Primary care notes CT scan 08/2015 [place] .    ASSESSMENT AND PLAN:  1. CAD by CT normal ETT 08/2015 [place]  continue ASA will update stress testing and calcium score 2. HTN  Well controlled.  Continue current medications and low sodium Dash type diet.   3. Chol continue on Vytorin had labs last week with primary indicates LDL at goal  4. Smoking:  CT with no asbestosis or cancer trying magnets now counseled f/u primary 5. Varicose Veins with neuropathic pain f/u [facility] duplex 11/17/18 did not appear to show connection to saphenous vein  Current medicines are reviewed at length with the patient today.  The patient does not have concerns regarding medicines.  The following changes have been made:  no change  Labs/ tests ordered today include: None   Orders Placed This Encounter  Procedures  . EKG 12-Lead    Tests:  Calcium score, ETT  Disposition:   FU with me in a year      Signed, Jenkins Rouge, MD  12/14/2019 2:17 PM    [facility] [place], [place], [place]  [address] Phone: [phone]; Fax: [phone]

## 2019-12-14 ENCOUNTER — Ambulatory Visit: Payer: Medicare Other | Admitting: Cardiovascular Disease

## 2019-12-14 ENCOUNTER — Encounter: Payer: Self-pay | Admitting: Cardiovascular Disease

## 2019-12-14 ENCOUNTER — Other Ambulatory Visit: Payer: Self-pay

## 2019-12-14 VITALS — BP 110/72 | HR 76 | Ht 74.0 in | Wt 238.4 lb

## 2019-12-14 DIAGNOSIS — I2510 Atherosclerotic heart disease of native coronary artery without angina pectoris: Secondary | ICD-10-CM

## 2019-12-14 DIAGNOSIS — I10 Essential (primary) hypertension: Secondary | ICD-10-CM

## 2019-12-14 NOTE — Patient Instructions (Addendum)
Medication Instructions:   *If you need a refill on your cardiac medications before your next appointment, please call your pharmacy*  Lab Work:  If you have labs (blood work) drawn today and your tests are completely normal, you will receive your results only by: Marland Kitchen MyChart Message (if you have MyChart) OR . A paper copy in the mail If you have any lab test that is abnormal or we need to change your treatment, we will call you to review the results.  Testing/Procedures: Your physician has requested that you have an exercise tolerance test. For further information please visit [email or web address] Please also follow instruction sheet, as given.  Cardiac CT scanning for calcium score, (CAT scanning), is a noninvasive, special x-ray that produces cross-sectional images of the body using x-rays and a computer. CT scans help physicians diagnose and treat medical conditions. For some CT exams, a contrast material is used to enhance visibility in the area of the body being studied. CT scans provide greater clarity and reveal more details than regular x-ray exams.  Follow-Up: At [facility], you and your health needs are our priority.  As part of our continuing mission to provide you with exceptional heart care, we have created designated Provider Care Teams.  These Care Teams include your primary Cardiologist (physician) and Advanced Practice Providers (APPs -  Physician Assistants and Nurse Practitioners) who all work together to provide you with the care you need, when you need it.  Your next appointment:   1 year(s)  The format for your next appointment:   In Person  Provider:   You may see Dr. Eden Emms or one of the following Advanced Practice Providers on your designated Care Team:    Norma Fredrickson, NP  Nada Boozer, NP  Georgie Chard, NP

## 2019-12-27 DIAGNOSIS — H532 Diplopia: Secondary | ICD-10-CM | POA: Diagnosis not present

## 2019-12-27 DIAGNOSIS — H2513 Age-related nuclear cataract, bilateral: Secondary | ICD-10-CM | POA: Diagnosis not present

## 2020-01-13 ENCOUNTER — Other Ambulatory Visit: Payer: Self-pay

## 2020-01-13 ENCOUNTER — Telehealth: Payer: Self-pay | Admitting: Cardiovascular Disease

## 2020-01-13 ENCOUNTER — Other Ambulatory Visit (HOSPITAL_COMMUNITY): Payer: Medicare Other

## 2020-01-13 ENCOUNTER — Other Ambulatory Visit (HOSPITAL_COMMUNITY)
Admission: RE | Admit: 2020-01-13 | Discharge: 2020-01-13 | Disposition: A | Discharge status: Home / Self Care | Payer: Medicare Other | Source: Ambulatory Visit | Attending: Cardiovascular Disease | Admitting: Cardiovascular Disease

## 2020-01-13 DIAGNOSIS — Z20822 Contact with and (suspected) exposure to covid-19: Secondary | ICD-10-CM | POA: Diagnosis present

## 2020-01-13 NOTE — Telephone Encounter (Signed)
Spoke with patient and his wife. Several questions. Explained the gxt and calcium score ct and reasoning for them. Explained reason for needing covid screen prior to gxt.  He is going to get his covid screening this afternoon at [facility]  And come Tuesday for gxt. He will not have calcium score ct unless it is in an open scanner. He is aware I am forwarding to Dr. Fabio Bering primary nurse to see if his calcium score can be done in an open scanner and arrange if possible.  Pt would like to sign DPR for his wife when he comes to office.

## 2020-01-13 NOTE — Telephone Encounter (Signed)
Please call and discuss the test I need to have on 01-17-20. Couldn't explain to my wife what the MD ordered.

## 2020-01-14 LAB — SARS CORONAVIRUS 2 (TAT 6-24 HRS): SARS Coronavirus 2: NEGATIVE

## 2020-01-16 ENCOUNTER — Other Ambulatory Visit (HOSPITAL_COMMUNITY): Payer: Medicare Other

## 2020-01-16 NOTE — Telephone Encounter (Signed)
Can you possibly call patient and explain how open the CT can be for just a calcium score?

## 2020-01-17 ENCOUNTER — Ambulatory Visit (INDEPENDENT_AMBULATORY_CARE_PROVIDER_SITE_OTHER)
Admission: RE | Admit: 2020-01-17 | Discharge: 2020-01-17 | Disposition: A | Discharge status: Home / Self Care | Payer: Self-pay | Source: Ambulatory Visit | Attending: Cardiovascular Disease | Admitting: Cardiovascular Disease

## 2020-01-17 ENCOUNTER — Ambulatory Visit (INDEPENDENT_AMBULATORY_CARE_PROVIDER_SITE_OTHER): Payer: Medicare Other

## 2020-01-17 ENCOUNTER — Other Ambulatory Visit: Payer: Self-pay

## 2020-01-17 ENCOUNTER — Telehealth: Payer: Self-pay

## 2020-01-17 ENCOUNTER — Inpatient Hospital Stay: Admission: RE | Admit: 2020-01-17 | Payer: Medicare Other | Source: Ambulatory Visit

## 2020-01-17 DIAGNOSIS — R9439 Abnormal result of other cardiovascular function study: Secondary | ICD-10-CM

## 2020-01-17 DIAGNOSIS — I2510 Atherosclerotic heart disease of native coronary artery without angina pectoris: Secondary | ICD-10-CM

## 2020-01-17 DIAGNOSIS — I10 Essential (primary) hypertension: Secondary | ICD-10-CM | POA: Diagnosis not present

## 2020-01-17 DIAGNOSIS — E785 Hyperlipidemia, unspecified: Secondary | ICD-10-CM

## 2020-01-17 LAB — EXERCISE TOLERANCE TEST
Estimated workload: 7 METS
Exercise duration (min): 5 min
Exercise duration (sec): 36 s
MPHR: 154 {beats}/min
Peak HR: 122 {beats}/min
Percent HR: 79 %
RPE: 15
Rest HR: 71 {beats}/min

## 2020-01-17 MED ORDER — CARVEDILOL 3.125 MG PO TABS: 3.1250 mg | ORAL_TABLET | Freq: Two times a day (BID) | ORAL | 3 refills | Status: DC

## 2020-01-17 NOTE — Telephone Encounter (Signed)
-----   Message from Wendall Stade, MD sent at 01/17/2020 12:28 PM EDT ----- ETT positive and BP extremely high with stress Start coreg 3.125 bid Needs f/u with me to discuss cath or can schedule if he is ok with that.

## 2020-01-17 NOTE — Telephone Encounter (Signed)
Patient had two test today, GXT and CT calcium score. Per Dr. Jen Mow score very high involving all 2 vessels need updated lipid / liver results already on statin ETT positive f/u with me to discuss cath or arrange it for this week/next. Patient's BP extremely high with stress, start coreg 3.125 mg BID. Patient has appointment this Thursday to discuss cath with Dr. Eden Emms. Will have patient get lab work at [facility] in [place]. Patient knows to be fasting. Will send in Coreg 3.125 mg BID to patient's pharmacy of choice.

## 2020-01-18 ENCOUNTER — Other Ambulatory Visit: Payer: Self-pay

## 2020-01-18 DIAGNOSIS — R9439 Abnormal result of other cardiovascular function study: Secondary | ICD-10-CM | POA: Diagnosis not present

## 2020-01-18 DIAGNOSIS — E785 Hyperlipidemia, unspecified: Secondary | ICD-10-CM | POA: Diagnosis not present

## 2020-01-18 DIAGNOSIS — I10 Essential (primary) hypertension: Secondary | ICD-10-CM | POA: Diagnosis not present

## 2020-01-18 NOTE — Progress Notes (Signed)
Cardiology Office Note   Date:  01/19/2020   ID:  Harold Brady, Harold Brady [birth year], MRN [id number]  PCP:  [facility]  Cardiologist:   Charlton Haws, MD   No chief complaint on file.     History of Present Illness: Harold Brady is a 69 y.o. male who presents for f/u of CAD CT 2016 showing calcified coronary arteries Noted in Epic normal Echo and myovue done in 2007 Seen by  Dr Elease Hashimoto After getting sick eating at [employer]  CRF;s include  HTN and elevated lipids   He indicates that he had echo and ETT in February 2016 with Dr Onnie Boer  at 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   Past Medical History:  Diagnosis Date   HTN (hypertension)    Hyperlipemia    Pericardial effusion    Periodontal disease    Plantar fasciitis     Past Surgical History:  Procedure Laterality Date   BACK SURGERY     L4,L5     Current Outpatient Medications  Medication Sig Dispense Refill   carvedilol (COREG) 3.125 MG tablet Take 1 tablet (3.125 mg total) by mouth 2 (two)  times daily with a meal. 180 tablet 3   ezetimibe-simvastatin (VYTORIN) 10-40 MG per tablet Take 1 tablet by mouth at bedtime. 30 tablet 4   irbesartan (AVAPRO) 150 MG tablet Take 150 mg by mouth daily.      omega-3 acid ethyl esters (LOVAZA) 1 g capsule Take 2 capsules (2 g total) by mouth 2 (two) times daily. Please make annual appt with Dr. Elease Hashimoto for future refills. Thank you 120 capsule 2   No current facility-administered medications for this visit.    Allergies:   Penicillins, Procaine, Lipitor [atorvastatin calcium], and Niaspan [niacin er]    Social History:  The patient  reports that he has been smoking cigarettes. He started smoking about 54 years ago. He has a 40.00 pack-year smoking history. He has never used smokeless tobacco. He reports current alcohol use.   Family History:  The patient's family history includes Cancer in his father and mother; Coronary artery disease in his mother; Hypertension in his brother and father.    ROS:  Please see the history of present illness.   Otherwise, review of systems are positive for none.   All other systems are reviewed and negative.    PHYSICAL EXAM: VS:  BP 120/72    Pulse 67    Ht 6\' 2"  (1.88 m)    Wt 230 lb (104.3 kg)  SpO2 98%    BMI 29.53 kg/m  , BMI Body mass index is 29.53 kg/m. Affect appropriate Healthy:  appears stated age 21: normal Neck supple with no adenopathy JVP normal no bruits no thyromegaly Lungs clear with no wheezing and good diaphragmatic motion Heart:  S1/S2 no murmur, no rub, gallop or click PMI normal Abdomen: benighn, BS positve, no tenderness, no AAA no bruit.  No HSM or HJR Distal pulses intact with no bruits No edema Neuro non-focal Skin warm and dry No muscular weakness Varicose veins bilaterally right >left     EKG:  2012 SR rate 69 LAFB normal ST segments  09/09/16  SR LAFB otherwise normal 12/14/19 SR LAD normal    Recent Labs: 01/18/2020: ALT 18; BUN 9; Creatinine, Ser 0.92;  Hemoglobin 16.2; Platelets 267; Potassium 4.5; Sodium 142    Lipid Panel    Component Value Date/Time   CHOL 114 01/18/2020 1138   TRIG 99 01/18/2020 1138   HDL 36 (L) 01/18/2020 1138   CHOLHDL 3.2 01/18/2020 1138   CHOLHDL 4 08/26/2011 1116   VLDL 13.6 08/26/2011 1116   LDLCALC 59 01/18/2020 1138      Wt Readings from Last 3 Encounters:  01/19/20 230 lb (104.3 kg)  12/14/19 238 lb 6.4 oz (108.1 kg)  11/17/18 230 lb (104.3 kg)      Other studies Reviewed: Additional studies/ records that were reviewed today include: Notes Dr Acie Fredrickson 2007 Primary care notes CT scan 08/2015 [facility] .    ASSESSMENT AND PLAN:  1. CAD : very high calcium score for age and positive ETT Discussed options and favor diagnostic cath. Risks including stroke , MI, bleeding, contrast reaction and need for emergency surgery discussed Willing to proceed. Lab called orders written 2. HTN  Coreg added very HTN response to exercise on ETT  3. Chol continue on Vytorin had labs last week with primary indicates LDL at goal  4. Smoking:  CT with no asbestosis or cancer trying magnets now counseled f/u primary 5. Varicose Veins with neuropathic pain f/u [facility] duplex 11/17/18 did not appear to show connection to saphenous vein  Current medicines are reviewed at length with the patient today.  The patient does not have concerns regarding medicines.  The following changes have been made:  Coreg 3.125 bid   Labs/ tests ordered today include: Pre Cath   No orders of the defined types were placed in this encounter.    Disposition:   FU with PA/NP post cath and me in 3 months     Signed, Jenkins Rouge, MD  01/19/2020 2:54 PM    [facility] [place], [place], [place]  [address] Phone: [phone]; Fax: [phone]

## 2020-01-18 NOTE — H&P (View-Only) (Signed)
°  °Cardiology Office Note ° ° °Date:  01/19/2020  ° °ID:  Harold Brady, DOB 01/12/1951, MRN [id number] ° °PCP:  [facility]  °Cardiologist:   Anjeli Casad, MD  ° °No chief complaint on file. ° ° °  °History of Present Illness: °Harold Brady is a 68 y.o. 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  ° °Past Medical History:  °Diagnosis Date  °• HTN (hypertension)   °• Hyperlipemia   °• Pericardial effusion   °• Periodontal disease   °• Plantar fasciitis   ° ° °Past Surgical History:  °Procedure Laterality Date  °• BACK SURGERY    ° L4,L5  ° ° ° °Current Outpatient Medications  °Medication Sig Dispense Refill  °• carvedilol (COREG) 3.125 MG tablet Take 1 tablet (3.125 mg total) by mouth 2 (two)  times daily with a meal. 180 tablet 3  °• ezetimibe-simvastatin (VYTORIN) 10-40 MG per tablet Take 1 tablet by mouth at bedtime. 30 tablet 4  °• irbesartan (AVAPRO) 150 MG tablet Take 150 mg by mouth daily.     °• omega-3 acid ethyl esters (LOVAZA) 1 g capsule Take 2 capsules (2 g total) by mouth 2 (two) times daily. Please make annual appt with Dr. Nahser for future refills. Thank you 120 capsule 2  ° °No current facility-administered medications for this visit.  ° ° °Allergies:   Penicillins, Procaine, Lipitor [atorvastatin calcium], and Niaspan [niacin er]  ° ° °Social History:  The patient  reports that he has been smoking cigarettes. He started smoking about 54 years ago. He has a 40.00 pack-year smoking history. He has never used smokeless tobacco. He reports current alcohol use.  ° °Family History:  The patient's family history includes Cancer in his father and mother; Coronary artery disease in his mother; Hypertension in his brother and father.  ° ° °ROS:  Please see the history of present illness.   Otherwise, review of systems are positive for none.   All other systems are reviewed and negative.  ° ° °PHYSICAL EXAM: °VS:  BP 120/72    Pulse 67    Ht 6' 2" (1.88 m)    Wt 230 lb (104.3 kg)      SpO2 98%    BMI 29.53 kg/m²  , BMI Body mass index is 29.53 kg/m². °Affect appropriate °Healthy:  appears stated age °HEENT: normal °Neck supple with no adenopathy °JVP normal no bruits no thyromegaly °Lungs clear with no wheezing and good diaphragmatic motion °Heart:  S1/S2 no murmur, no rub, gallop or click °PMI normal °Abdomen: benighn, BS positve, no tenderness, no AAA °no bruit.  No HSM or HJR °Distal pulses intact with no bruits °No edema °Neuro non-focal °Skin warm and dry °No muscular weakness °Varicose veins bilaterally right >left  ° ° ° °EKG:  2012 SR rate 69 LAFB normal ST segments  09/09/16  SR LAFB otherwise normal 12/14/19 SR LAD normal  ° ° °Recent Labs: °01/18/2020: ALT 18; BUN 9; Creatinine, Ser 0.92;  Hemoglobin 16.2; Platelets 267; Potassium 4.5; Sodium 142  ° ° °Lipid Panel °   °Component Value Date/Time  ° CHOL 114 01/18/2020 1138  ° TRIG 99 01/18/2020 1138  ° HDL 36 (L) 01/18/2020 1138  ° CHOLHDL 3.2 01/18/2020 1138  ° CHOLHDL 4 08/26/2011 1116  ° VLDL 13.6 08/26/2011 1116  ° LDLCALC 59 01/18/2020 1138  ° °  ° °Wt Readings from Last 3 Encounters:  °01/19/20 230 lb (104.3 kg)  °12/14/19 238 lb 6.4 oz (108.1 kg)  °11/17/18 230 lb (104.3 kg)  °  ° ° °Other studies Reviewed: °Additional studies/ records that were reviewed today include: Notes Dr Nahser 2007 Primary care notes °CT scan 08/2015 [facility] . ° ° ° °ASSESSMENT AND PLAN: ° °1. CAD : very high calcium score for age and positive ETT Discussed options and favor diagnostic cath. Risks including stroke , MI, bleeding, contrast reaction and need for emergency surgery discussed Willing to proceed. Lab called orders written °2. HTN  Coreg added very HTN response to exercise on ETT  °3. Chol continue on Vytorin had labs last week with primary indicates LDL at goal  °4. Smoking:  CT with no asbestosis or cancer trying magnets now counseled f/u primary °5. Varicose Veins with neuropathic pain f/u [facility] duplex 11/17/18 did not appear to show connection to saphenous vein ° °Current medicines are reviewed at length with the patient today.  The patient does not have concerns regarding medicines. ° °The following changes have been made:  Coreg 3.125 bid  ° °Labs/ tests ordered today include: Pre Cath  ° °No orders of the defined types were placed in this encounter. ° ° ° °Disposition:   FU with PA/NP post cath and me in 3 months  ° ° ° °Signed, °Gerritt Galentine, MD  °01/19/2020 2:54 PM    °[facility] °[address], [place], [place]  [address] °Phone: [phone]; Fax: [phone]  °

## 2020-01-19 ENCOUNTER — Other Ambulatory Visit: Payer: Self-pay

## 2020-01-19 ENCOUNTER — Telehealth: Payer: Self-pay | Admitting: Cardiovascular Disease

## 2020-01-19 ENCOUNTER — Other Ambulatory Visit: Payer: Self-pay | Admitting: Cardiovascular Disease

## 2020-01-19 ENCOUNTER — Encounter: Payer: Self-pay | Admitting: Cardiovascular Disease

## 2020-01-19 ENCOUNTER — Ambulatory Visit: Payer: Medicare Other | Admitting: Cardiovascular Disease

## 2020-01-19 VITALS — BP 120/72 | HR 67 | Ht 74.0 in | Wt 230.0 lb

## 2020-01-19 DIAGNOSIS — I10 Essential (primary) hypertension: Secondary | ICD-10-CM

## 2020-01-19 DIAGNOSIS — R9439 Abnormal result of other cardiovascular function study: Secondary | ICD-10-CM

## 2020-01-19 LAB — CBC WITH DIFFERENTIAL/PLATELET
Basophils Absolute: 0 10*3/uL (ref 0.0–0.2)
Basos: 0 %
EOS (ABSOLUTE): 0.1 10*3/uL (ref 0.0–0.4)
Eos: 1 %
Hematocrit: 49.2 % (ref 37.5–51.0)
Hemoglobin: 16.2 g/dL (ref 13.0–17.7)
Immature Grans (Abs): 0 10*3/uL (ref 0.0–0.1)
Immature Granulocytes: 0 %
Lymphocytes Absolute: 2.4 10*3/uL (ref 0.7–3.1)
Lymphs: 22 %
MCH: 29.2 pg (ref 26.6–33.0)
MCHC: 32.9 g/dL (ref 31.5–35.7)
MCV: 89 fL (ref 79–97)
Monocytes Absolute: 0.9 10*3/uL (ref 0.1–0.9)
Monocytes: 9 %
Neutrophils Absolute: 7.2 10*3/uL — ABNORMAL HIGH (ref 1.4–7.0)
Neutrophils: 68 %
Platelets: 267 10*3/uL (ref 150–450)
RBC: 5.55 x10E6/uL (ref 4.14–5.80)
RDW: 12.8 % (ref 11.6–15.4)
WBC: 10.6 10*3/uL (ref 3.4–10.8)

## 2020-01-19 LAB — COMPREHENSIVE METABOLIC PANEL
ALT: 18 IU/L (ref 0–44)
AST: 22 IU/L (ref 0–40)
Albumin/Globulin Ratio: 1.6 (ref 1.2–2.2)
Albumin: 4.5 g/dL (ref 3.8–4.8)
Alkaline Phosphatase: 87 IU/L (ref 39–117)
BUN/Creatinine Ratio: 10 (ref 10–24)
BUN: 9 mg/dL (ref 8–27)
Bilirubin Total: 0.5 mg/dL (ref 0.0–1.2)
CO2: 23 mmol/L (ref 20–29)
Calcium: 9.6 mg/dL (ref 8.6–10.2)
Chloride: 102 mmol/L (ref 96–106)
Creatinine, Ser: 0.92 mg/dL (ref 0.76–1.27)
GFR calc Af Amer: 100 mL/min/{1.73_m2} (ref >59)
GFR calc non Af Amer: 86 mL/min/{1.73_m2} (ref >59)
Globulin, Total: 2.8 g/dL (ref 1.5–4.5)
Glucose: 93 mg/dL (ref 65–99)
Potassium: 4.5 mmol/L (ref 3.5–5.2)
Sodium: 142 mmol/L (ref 134–144)
Total Protein: 7.3 g/dL (ref 6.0–8.5)

## 2020-01-19 LAB — LIPID PANEL
Chol/HDL Ratio: 3.2 ratio (ref 0.0–5.0)
Cholesterol, Total: 114 mg/dL (ref 100–199)
HDL: 36 mg/dL — ABNORMAL LOW (ref >39)
LDL Chol Calc (NIH): 59 mg/dL (ref 0–99)
Triglycerides: 99 mg/dL (ref 0–149)
VLDL Cholesterol Cal: 19 mg/dL (ref 5–40)

## 2020-01-19 MED ORDER — SODIUM CHLORIDE 0.9% FLUSH: 3.0000 mL | Freq: Two times a day (BID) | INTRAVENOUS | Status: DC

## 2020-01-19 NOTE — Patient Instructions (Addendum)
Medication Instructions:  *If you need a refill on your cardiac medications before your next appointment, please call your pharmacy*  Lab Work: If you have labs (blood work) drawn today and your tests are completely normal, you will receive your results only by: Marland Kitchen MyChart Message (if you have MyChart) OR . A paper copy in the mail If you have any lab test that is abnormal or we need to change your treatment, we will call you to review the results.  Testing/Procedures: Your physician has requested that you have a cardiac catheterization. Cardiac catheterization is used to diagnose and/or treat various heart conditions. Doctors may recommend this procedure for a number of different reasons. The most common reason is to evaluate chest pain. Chest pain can be a symptom of coronary artery disease (CAD), and cardiac catheterization can show whether plaque is narrowing or blocking your heart's arteries. This procedure is also used to evaluate the valves, as well as measure the blood flow and oxygen levels in different parts of your heart. For further information please visit HugeFiesta.[email or web address]. Please follow instruction sheet, as given.  Follow-Up: At [facility], you and your health needs are our priority.  As part of our continuing mission to provide you with exceptional heart care, we have created designated Provider Care Teams.  These Care Teams include your primary Cardiologist (physician) and Advanced Practice Providers (APPs -  Physician Assistants and Nurse Practitioners) who all work together to provide you with the care you need, when you need it.  We recommend signing up for the patient portal called "MyChart".  Sign up information is provided on this After Visit Summary.  MyChart is used to connect with patients for Virtual Visits (Telemedicine).  Patients are able to view lab/test results, encounter notes, upcoming appointments, etc.  Non-urgent messages can be sent to your provider as well.    To learn more about what you can do with MyChart, go to [email or web address].    Your next appointment:   2 week(s)  The format for your next appointment:   Either In Person or Virtual  Provider:   You may see Dr. Johnsie Cancel or one of the following Advanced Practice Providers on your designated Care Team:    Truitt Merle, NP  Cecilie Kicks, NP  Kathyrn Drown, NP     [facility] OFFICE [place], [place] [address] Dept: [phone] Loc: [place]  01/19/2020  You are scheduled for a Cardiac Catheterization on Tuesday, March 30 with Dr. Daneen Schick.  1. Please arrive at the [facility] (Main Entrance A) at [facility]: [address] [place], [place] [address] at 8:30 AM (This time is two hours before your procedure to ensure your preparation). Free valet parking service is available.   Special note: Every effort is made to have your procedure done on time. Please understand that emergencies sometimes delay scheduled procedures.  2. Diet: Do not eat solid foods after midnight.  The patient may have clear liquids until 5am upon the day of the procedure.  3. Labs: Already done 01/18/20  Your Pre-procedure COVID-19 Testing will be done on 01/21/20 at 10:55 am at [facility] at [address], [place], [place] [address]. Once you arrive at the testing site, stay in the right hand lane, go under the building overhang not the tent. If you are tested under the tent your results may  not be back before your procedure. Please be on time for your appointment.  After your swab you will be given a mask to wear and instructed to go home and quarantine/no visitors until after your procedure. If you test positive you will be notified and your procedure will be cancelled.   4. Medication instructions in preparation for your procedure:    Contrast Allergy: No  Stop taking, Avapro (Irbesartan) Tuesday, March 30,  On the morning of your procedure, take your Aspirin and any morning medicines NOT listed above.  You may use sips of water.  5. Plan for one night stay--bring personal belongings. 6. Bring a current list of your medications and current insurance cards. 7. You MUST have a responsible person to drive you home. 8. Someone MUST be with you the first 24 hours after you arrive home or your discharge will be delayed. 9. Please wear clothes that are easy to get on and off and wear slip-on shoes.  Thank you for allowing [place] to care for you!   -- [facility]

## 2020-01-19 NOTE — Telephone Encounter (Signed)
Harold Brady patient's wife calling to request she come with the patient to his appointment today at 2:30pm, because he has a hearing problem. The patient also requested she come.

## 2020-01-19 NOTE — Telephone Encounter (Signed)
Informed patient that it was fine for him to bring his wife and to make sure he fills out a DPR form so we can talk to his wife. Patient verbalized understanding.

## 2020-01-21 ENCOUNTER — Other Ambulatory Visit (HOSPITAL_COMMUNITY)
Admission: RE | Admit: 2020-01-21 | Discharge: 2020-01-21 | Disposition: A | Discharge status: Home / Self Care | Payer: Medicare Other | Source: Ambulatory Visit | Attending: Interventional Cardiology | Admitting: Interventional Cardiology

## 2020-01-21 DIAGNOSIS — I10 Essential (primary) hypertension: Secondary | ICD-10-CM | POA: Diagnosis not present

## 2020-01-21 DIAGNOSIS — Z880 Allergy status to penicillin: Secondary | ICD-10-CM | POA: Diagnosis not present

## 2020-01-21 DIAGNOSIS — E785 Hyperlipidemia, unspecified: Secondary | ICD-10-CM | POA: Diagnosis not present

## 2020-01-21 DIAGNOSIS — Z79899 Other long term (current) drug therapy: Secondary | ICD-10-CM | POA: Diagnosis not present

## 2020-01-21 DIAGNOSIS — I8393 Asymptomatic varicose veins of bilateral lower extremities: Secondary | ICD-10-CM | POA: Diagnosis not present

## 2020-01-21 DIAGNOSIS — Z20822 Contact with and (suspected) exposure to covid-19: Secondary | ICD-10-CM | POA: Diagnosis not present

## 2020-01-21 DIAGNOSIS — G629 Polyneuropathy, unspecified: Secondary | ICD-10-CM | POA: Diagnosis not present

## 2020-01-21 DIAGNOSIS — I2510 Atherosclerotic heart disease of native coronary artery without angina pectoris: Secondary | ICD-10-CM | POA: Diagnosis not present

## 2020-01-21 DIAGNOSIS — F17210 Nicotine dependence, cigarettes, uncomplicated: Secondary | ICD-10-CM | POA: Diagnosis not present

## 2020-01-21 LAB — SARS CORONAVIRUS 2 (TAT 6-24 HRS): SARS Coronavirus 2: NEGATIVE

## 2020-01-23 ENCOUNTER — Telehealth: Payer: Self-pay | Admitting: *Deleted

## 2020-01-23 NOTE — Telephone Encounter (Signed)
Pt contacted pre-catheterization scheduled at [facility] for: Tuesday January 24, 2020 10:30 AM Verified arrival time and place: [facility] Main Entrance A [facility]) at: 8:30 AM   No solid food after midnight prior to cath, clear liquids until 5 AM day of procedure. Contrast allergy: no  AM meds can be  taken pre-cath with sip of water including: ASA 81 mg   Confirmed patient has responsible adult to drive home post procedure and observe 24 hours after arriving home: yes   Currently, due to Covid-19 pandemic, only one person will be allowed with patient. Must be the same person for patient's entire stay and will be required to wear a mask. They will be asked to wait in the waiting room for the duration of the patient's stay.  Patients are required to wear a mask when they enter the hospital.      COVID-19 Pre-Screening Questions:  . In the past 7 to 10 days have you had a cough,  shortness of breath, headache, congestion, fever (100 or greater) body aches, chills, sore throat, or sudden loss of taste or sense of smell? no . Have you been around anyone with known Covid 19 in the past 7-10 days? no . Have you been around anyone who is awaiting Covid 19 test results in the past 7 to 10 days? no . Have you been around anyone who has been exposed to Covid 19, or has mentioned symptoms of Covid 19 within the past 7 to 10 days? no  I reviewed procedure/mask/visitor instructions, COVID-19 screening questions with patient.

## 2020-01-23 NOTE — H&P (Signed)
No symptoms Very high calcium score. ETT with hypertensive response and ST abnormality

## 2020-01-24 ENCOUNTER — Encounter (HOSPITAL_COMMUNITY)
Admission: RE | Disposition: A | Discharge status: Home / Self Care | Payer: Self-pay | Source: Home / Self Care | Attending: Interventional Cardiology

## 2020-01-24 ENCOUNTER — Other Ambulatory Visit: Payer: Self-pay

## 2020-01-24 ENCOUNTER — Encounter: Payer: Self-pay | Admitting: Cardiology

## 2020-01-24 ENCOUNTER — Ambulatory Visit (HOSPITAL_COMMUNITY)
Admission: RE | Admit: 2020-01-24 | Discharge: 2020-01-24 | Disposition: A | Discharge status: Home / Self Care | Payer: Medicare Other | Attending: Interventional Cardiology | Admitting: Interventional Cardiology

## 2020-01-24 DIAGNOSIS — F17210 Nicotine dependence, cigarettes, uncomplicated: Secondary | ICD-10-CM | POA: Insufficient documentation

## 2020-01-24 DIAGNOSIS — I2510 Atherosclerotic heart disease of native coronary artery without angina pectoris: Secondary | ICD-10-CM | POA: Diagnosis not present

## 2020-01-24 DIAGNOSIS — I313 Pericardial effusion (noninflammatory): Secondary | ICD-10-CM | POA: Diagnosis present

## 2020-01-24 DIAGNOSIS — I8393 Asymptomatic varicose veins of bilateral lower extremities: Secondary | ICD-10-CM | POA: Insufficient documentation

## 2020-01-24 DIAGNOSIS — Z79899 Other long term (current) drug therapy: Secondary | ICD-10-CM | POA: Insufficient documentation

## 2020-01-24 DIAGNOSIS — Z20822 Contact with and (suspected) exposure to covid-19: Secondary | ICD-10-CM | POA: Diagnosis not present

## 2020-01-24 DIAGNOSIS — R9439 Abnormal result of other cardiovascular function study: Secondary | ICD-10-CM

## 2020-01-24 DIAGNOSIS — I10 Essential (primary) hypertension: Secondary | ICD-10-CM | POA: Diagnosis not present

## 2020-01-24 DIAGNOSIS — G629 Polyneuropathy, unspecified: Secondary | ICD-10-CM | POA: Diagnosis not present

## 2020-01-24 DIAGNOSIS — Z880 Allergy status to penicillin: Secondary | ICD-10-CM | POA: Insufficient documentation

## 2020-01-24 DIAGNOSIS — I3139 Other pericardial effusion (noninflammatory): Secondary | ICD-10-CM | POA: Diagnosis present

## 2020-01-24 DIAGNOSIS — E785 Hyperlipidemia, unspecified: Secondary | ICD-10-CM | POA: Diagnosis not present

## 2020-01-24 HISTORY — PX: LEFT HEART CATH AND CORONARY ANGIOGRAPHY: CATH118249

## 2020-01-24 SURGERY — LEFT HEART CATH AND CORONARY ANGIOGRAPHY
Anesthesia: LOCAL

## 2020-01-24 MED ORDER — SODIUM CHLORIDE 0.9 % IV SOLN: 250.0000 mL | INTRAVENOUS | Status: DC | PRN

## 2020-01-24 MED ORDER — HEPARIN (PORCINE) IN NACL 1000-0.9 UT/500ML-% IV SOLN
INTRAVENOUS | Status: DC | PRN
  Administered 2020-01-24 (×2): 500 mL

## 2020-01-24 MED ORDER — ONDANSETRON HCL 4 MG/2ML IJ SOLN: 4.0000 mg | Freq: Four times a day (QID) | INTRAMUSCULAR | Status: DC | PRN

## 2020-01-24 MED ORDER — SODIUM CHLORIDE 0.9% FLUSH: 3.0000 mL | INTRAVENOUS | Status: DC | PRN

## 2020-01-24 MED ORDER — FENTANYL CITRATE (PF) 100 MCG/2ML IJ SOLN
INTRAMUSCULAR | Status: AC
  Filled 2020-01-24: qty 2

## 2020-01-24 MED ORDER — VERAPAMIL HCL 2.5 MG/ML IV SOLN
INTRAVENOUS | Status: AC
  Filled 2020-01-24: qty 2

## 2020-01-24 MED ORDER — LABETALOL HCL 5 MG/ML IV SOLN: 10.0000 mg | INTRAVENOUS | Status: DC | PRN

## 2020-01-24 MED ORDER — HEPARIN SODIUM (PORCINE) 1000 UNIT/ML IJ SOLN
INTRAMUSCULAR | Status: AC
  Filled 2020-01-24: qty 1

## 2020-01-24 MED ORDER — SODIUM CHLORIDE 0.9 % WEIGHT BASED INFUSION: 1.0000 mL/kg/h | INTRAVENOUS | Status: DC

## 2020-01-24 MED ORDER — LIDOCAINE HCL (PF) 1 % IJ SOLN
INTRAMUSCULAR | Status: AC
  Filled 2020-01-24: qty 30

## 2020-01-24 MED ORDER — SODIUM CHLORIDE 0.9% FLUSH: 3.0000 mL | Freq: Two times a day (BID) | INTRAVENOUS | Status: DC

## 2020-01-24 MED ORDER — SODIUM CHLORIDE 0.9 % IV SOLN: INTRAVENOUS | Status: DC

## 2020-01-24 MED ORDER — MIDAZOLAM HCL 2 MG/2ML IJ SOLN
INTRAMUSCULAR | Status: AC
  Filled 2020-01-24: qty 2

## 2020-01-24 MED ORDER — SODIUM CHLORIDE 0.9 % WEIGHT BASED INFUSION
3.0000 mL/kg/h | INTRAVENOUS | Status: DC
  Administered 2020-01-24: 3 mL/kg/h via INTRAVENOUS

## 2020-01-24 MED ORDER — IOHEXOL 350 MG/ML SOLN
INTRAVENOUS | Status: DC | PRN
  Administered 2020-01-24: 75 mL

## 2020-01-24 MED ORDER — HEPARIN (PORCINE) IN NACL 1000-0.9 UT/500ML-% IV SOLN
INTRAVENOUS | Status: AC
  Filled 2020-01-24: qty 1000

## 2020-01-24 MED ORDER — HYDRALAZINE HCL 20 MG/ML IJ SOLN: 10.0000 mg | INTRAMUSCULAR | Status: DC | PRN

## 2020-01-24 MED ORDER — LIDOCAINE HCL (PF) 1 % IJ SOLN
INTRAMUSCULAR | Status: DC | PRN
  Administered 2020-01-24: 4 mL via INTRADERMAL

## 2020-01-24 MED ORDER — ASPIRIN 81 MG PO CHEW: 81.0000 mg | CHEWABLE_TABLET | ORAL | Status: DC

## 2020-01-24 MED ORDER — HEPARIN SODIUM (PORCINE) 1000 UNIT/ML IJ SOLN
INTRAMUSCULAR | Status: DC | PRN
  Administered 2020-01-24: 5000 [IU] via INTRAVENOUS

## 2020-01-24 MED ORDER — ACETAMINOPHEN 325 MG PO TABS: 650.0000 mg | ORAL_TABLET | ORAL | Status: DC | PRN

## 2020-01-24 MED ORDER — FENTANYL CITRATE (PF) 100 MCG/2ML IJ SOLN
INTRAMUSCULAR | Status: DC | PRN
  Administered 2020-01-24: 25 ug via INTRAVENOUS

## 2020-01-24 MED ORDER — ASPIRIN 81 MG PO CHEW: 81.0000 mg | CHEWABLE_TABLET | Freq: Every day | ORAL | Status: DC

## 2020-01-24 MED ORDER — MIDAZOLAM HCL 2 MG/2ML IJ SOLN
INTRAMUSCULAR | Status: DC | PRN
  Administered 2020-01-24 (×2): 1 mg via INTRAVENOUS

## 2020-01-24 SURGICAL SUPPLY — 10 items
CATH 5FR JL3.5 JR4 ANG PIG MP (CATHETERS) ×1 IMPLANT
DEVICE RAD COMP TR BAND LRG (VASCULAR PRODUCTS) ×1 IMPLANT
GLIDESHEATH SLEND A-KIT 6F 22G (SHEATH) ×1 IMPLANT
GUIDEWIRE INQWIRE 1.5J.035X260 (WIRE) IMPLANT
INQWIRE 1.5J .035X260CM (WIRE) ×2
KIT HEART LEFT (KITS) ×2 IMPLANT
PACK CARDIAC CATHETERIZATION (CUSTOM PROCEDURE TRAY) ×2 IMPLANT
SHEATH PROBE COVER 6X72 (BAG) ×1 IMPLANT
TRANSDUCER W/STOPCOCK (MISCELLANEOUS) ×2 IMPLANT
TUBING CIL FLEX 10 FLL-RA (TUBING) ×2 IMPLANT

## 2020-01-24 NOTE — Progress Notes (Signed)
Discharge instructions reviewed with pt and his wife (via telephone) both voice understanding.  

## 2020-01-24 NOTE — CV Procedure (Signed)
   Coronary angiography via right radial approach using real-time vascular ultrasound for guidance.  Moderate three-vessel atherosclerosis without significant obstructive disease is noted.  Right dominant anatomy.  Normal LV function.  Normal LVEDP.  EF 60%.  Recommend aggressive risk factor modification: Blood pressure control, LDL target less than 70, smoking cessation, hemoglobin A1c less than 7, weight loss, 150 minutes of moderate activity per week.

## 2020-01-24 NOTE — Interval H&P Note (Signed)
Cath Lab Visit (complete for each Cath Lab visit)  Clinical Evaluation Leading to the Procedure:   ACS: No.  Non-ACS:    Anginal Classification: CCS III  Anti-ischemic medical therapy: Minimal Therapy (1 class of medications)  Non-Invasive Test Results: Intermediate-risk stress test findings: cardiac mortality 1-3%/year  Prior CABG: No previous CABG      History and Physical Interval Note:  01/24/2020 9:40 AM  Harold Brady  has presented today for surgery, with the diagnosis of abnnormal stress test.  The various methods of treatment have been discussed with the patient and family. After consideration of risks, benefits and other options for treatment, the patient has consented to  Procedure(s): LEFT HEART CATH AND CORONARY ANGIOGRAPHY (N/A) as a surgical intervention.  The patient's history has been reviewed, patient examined, no change in status, stable for surgery.  I have reviewed the patient's chart and labs.  Questions were answered to the patient's satisfaction.     Lyn Records III

## 2020-01-24 NOTE — Discharge Instructions (Signed)
Drink plenty of fluids Keep right arm at or above heart level  Radial Site Care  This sheet gives you information about how to care for yourself after your procedure. Your health care provider may also give you more specific instructions. If you have problems or questions, contact your health care provider. What can I expect after the procedure? After the procedure, it is common to have:  Bruising and tenderness at the catheter insertion area. Follow these instructions at home: Medicines  Take over-the-counter and prescription medicines only as told by your health care provider. Insertion site care  Follow instructions from your health care provider about how to take care of your insertion site. Make sure you: ? Wash your hands with soap and water before you change your bandage (dressing). If soap and water are not available, use hand sanitizer. ? Change your dressing as told by your health care provider. ? Leave stitches (sutures), skin glue, or adhesive strips in place. These skin closures may need to stay in place for 2 weeks or longer. If adhesive strip edges start to loosen and curl up, you may trim the loose edges. Do not remove adhesive strips completely unless your health care provider tells you to do that.  Check your insertion site every day for signs of infection. Check for: ? Redness, swelling, or pain. ? Fluid or blood. ? Pus or a bad smell. ? Warmth.  Do not take baths, swim, or use a hot tub until your health care provider approves.  You may shower 24-48 hours after the procedure, or as directed by your health care provider. ? Remove the dressing and gently wash the site with plain soap and water. ? Pat the area dry with a clean towel. ? Do not rub the site. That could cause bleeding.  Do not apply powder or lotion to the site. Activity   For 24 hours after the procedure, or as directed by your health care provider: ? Do not flex or bend the affected arm. ? Do  not push or pull heavy objects with the affected arm. ? Do not drive yourself home from the hospital or clinic. You may drive 24 hours after the procedure unless your health care provider tells you not to. ? Do not operate machinery or power tools.  Do not lift anything that is heavier than 10 lb (4.5 kg), or the limit that you are told, until your health care provider says that it is safe.  Ask your health care provider when it is okay to: ? Return to work or school. ? Resume usual physical activities or sports. ? Resume sexual activity. General instructions  If the catheter site starts to bleed, raise your arm and put firm pressure on the site. If the bleeding does not stop, get help right away. This is a medical emergency.  If you went home on the same day as your procedure, a responsible adult should be with you for the first 24 hours after you arrive home.  Keep all follow-up visits as told by your health care provider. This is important. Contact a health care provider if:  You have a fever.  You have redness, swelling, or yellow drainage around your insertion site. Get help right away if:  You have unusual pain at the radial site.  The catheter insertion area swells very fast.  The insertion area is bleeding, and the bleeding does not stop when you hold steady pressure on the area.  Your arm or hand   becomes pale, cool, tingly, or numb. These symptoms may represent a serious problem that is an emergency. Do not wait to see if the symptoms will go away. Get medical help right away. Call your local emergency services (911 in the [place] Do not drive yourself to the hospital. Summary  After the procedure, it is common to have bruising and tenderness at the site.  Follow instructions from your health care provider about how to take care of your radial site wound. Check the wound every day for signs of infection.  Do not lift anything that is heavier than 10 lb (4.5 kg), or the  limit that you are told, until your health care provider says that it is safe. This information is not intended to replace advice given to you by your health care provider. Make sure you discuss any questions you have with your health care provider. Document Revised: 11/18/2017 Document Reviewed: 11/18/2017 Elsevier Patient Education  2020 [employer]  

## 2020-01-25 ENCOUNTER — Telehealth: Payer: Self-pay | Admitting: Cardiovascular Disease

## 2020-01-25 ENCOUNTER — Other Ambulatory Visit: Payer: Self-pay

## 2020-01-25 ENCOUNTER — Emergency Department (HOSPITAL_COMMUNITY)
Admission: EM | Admit: 2020-01-25 | Discharge: 2020-01-26 | Disposition: A | Discharge status: Home / Self Care | Payer: Medicare Other | Attending: Emergency Medicine | Admitting: Emergency Medicine

## 2020-01-25 DIAGNOSIS — Z79899 Other long term (current) drug therapy: Secondary | ICD-10-CM | POA: Diagnosis not present

## 2020-01-25 DIAGNOSIS — F17210 Nicotine dependence, cigarettes, uncomplicated: Secondary | ICD-10-CM | POA: Diagnosis not present

## 2020-01-25 DIAGNOSIS — R21 Rash and other nonspecific skin eruption: Secondary | ICD-10-CM | POA: Insufficient documentation

## 2020-01-25 DIAGNOSIS — E7849 Other hyperlipidemia: Secondary | ICD-10-CM | POA: Diagnosis not present

## 2020-01-25 DIAGNOSIS — I10 Essential (primary) hypertension: Secondary | ICD-10-CM | POA: Diagnosis not present

## 2020-01-25 DIAGNOSIS — L231 Allergic contact dermatitis due to adhesives: Secondary | ICD-10-CM | POA: Diagnosis not present

## 2020-01-25 MED FILL — Verapamil HCl IV Soln 2.5 MG/ML: INTRAVENOUS | Qty: 2 | Status: AC

## 2020-01-25 NOTE — ED Triage Notes (Signed)
Pt in POV. Pt reports he had cardiac cath yesterday, had bandage placed on R wrist. Now has irritation from the bandage causing blisters.

## 2020-01-25 NOTE — Telephone Encounter (Signed)
New message   Patient's wife has questions about instructions after cath surgery. Please call.

## 2020-01-25 NOTE — Telephone Encounter (Signed)
Called patient back about his message. Patient wanted to know if it was okay to take splint off of his arm, that it was starting to hurt and they told him he could remove it after 24 hours. Informed patient to take it off and to leave bandages on his site for at least 48 hours. Patient also wanted to know how long he needed to wait before picking up anything greater than 5 lbs. Informed patient a week should be long enough. Informed patient to read back over his discharge instructions and keep his follow up appointment with Dr. Eden Emms.

## 2020-01-26 NOTE — ED Provider Notes (Signed)
[facility] EMERGENCY DEPARTMENT Provider Note   CSN: [id number] Arrival date & time: 01/25/20  2207     History Chief Complaint  Patient presents with  . Wound Check    Harold Brady is a 69 y.o. male.  Patient presents to the emergency department with a chief complaint of allergic reaction to adhesive.  He states that he had a cardiac cath on Tuesday morning.  States that he noticed some small blisters surrounding the adhesive bandage.  He denies any other symptoms.  Denies any history of the same.  The history is provided by the patient. No language interpreter was used.       Past Medical History:  Diagnosis Date  . HTN (hypertension)   . Hyperlipemia   . Pericardial effusion   . Periodontal disease   . Plantar fasciitis     Patient Active Problem List   Diagnosis Date Noted  . Abnormal stress test   . Chronic sinusitis 08/26/2011  . Pericardial effusion   . HTN (hypertension)   . Hyperlipemia     Past Surgical History:  Procedure Laterality Date  . BACK SURGERY     L4,L5  . LEFT HEART CATH AND CORONARY ANGIOGRAPHY N/A 01/24/2020   Procedure: LEFT HEART CATH AND CORONARY ANGIOGRAPHY;  Surgeon: Belva Crome, MD;  Location: [place] [facility];  Service: Cardiovascular;  Laterality: N/A;       Family History  Problem Relation Age of Onset  . Cancer Father   . Hypertension Father   . Coronary artery disease Mother   . Cancer Mother        GALLBLADDER  . Hypertension Brother     Social History   Tobacco Use  . Smoking status: Current Every Day Smoker    Packs/day: 1.00    Years: 40.00    Pack years: 40.00    Types: Cigarettes    Start date: 09/09/1965  . Smokeless tobacco: Never Used  Substance Use Topics  . Alcohol use: Yes    Comment: OCCASIONAL BEER  . Drug use: Not on file    Home Medications Prior to Admission medications   Medication Sig Start Date End Date Taking? Authorizing Provider  carvedilol (COREG) 3.125  MG tablet Take 1 tablet (3.125 mg total) by mouth 2 (two) times daily with a meal. 01/17/20   Josue Hector, MD  citalopram (CELEXA) 20 MG tablet Take 20 mg by mouth at bedtime.    [provider]  ezetimibe (ZETIA) 10 MG tablet Take 10 mg by mouth at bedtime.    [provider]  ibuprofen (ADVIL) 200 MG tablet Take 400 mg by mouth every 8 (eight) hours as needed (pain.).    [provider]  Multiple Vitamin (MULTIVITAMIN WITH MINERALS) TABS tablet Take 1 tablet by mouth 2 (two) times a week.    [provider]  olmesartan (BENICAR) 40 MG tablet Take 40 mg by mouth at bedtime.    [provider]  Omega-3 Fatty Acids (FISH OIL) 1000 MG CAPS Take 1,000 mg by mouth in the morning and at bedtime.    [provider]  rosuvastatin (CRESTOR) 10 MG tablet Take 10 mg by mouth at bedtime.    [provider]    Allergies    Penicillins, Procaine, Lipitor [atorvastatin calcium], and Niaspan [niacin er]  Review of Systems   Review of Systems  Constitutional: Negative for diaphoresis and fever.  Skin: Positive for rash. Negative for color change,  pallor and wound.    Physical Exam Updated Vital Signs BP (!) 162/120 (BP Location: Left Arm)   Pulse 71   Temp 98.1 F (36.7 C) (Oral)   Resp 16   SpO2 98%   Physical Exam Vitals and nursing note reviewed.  Constitutional:      General: He is not in acute distress.    Appearance: He is well-developed. He is not ill-appearing.  HENT:     Head: Normocephalic and atraumatic.  Eyes:     Conjunctiva/sclera: Conjunctivae normal.  Cardiovascular:     Rate and Rhythm: Normal rate.  Pulmonary:     Effort: Pulmonary effort is normal. No respiratory distress.  Abdominal:     General: There is no distension.  Musculoskeletal:     Cervical back: Neck supple.     Comments: Moves all extremities  Skin:    General: Skin is warm and dry.     Comments: A few small vesicles where adhesive from  Tegaderm contacts the skin on the right wrist, no erythema or sign of infection.  Neurological:     Mental Status: He is alert and oriented to person, place, and time.  Psychiatric:        Mood and Affect: Mood normal.        Behavior: Behavior normal.     ED Results / Procedures / Treatments   Labs (all labs ordered are listed, but only abnormal results are displayed) Labs Reviewed - No data to display  EKG None  Radiology CARDIAC CATHETERIZATION  Result Date: 01/24/2020  Moderate to moderately severe two-vessel coronary disease, circumflex and LAD.  Left main is widely patent  Tandem 60% mid LAD stenosis followed by eccentric 50 to 70% mid stenosis (depending upon review).  Circumflex is nondominant.  Proximal artery contains 50% narrowing, mid vessel contains 50 to 60% narrowing, further down in the mid vessel before the origin of a large second obtuse marginal there is eccentric 60 to 70% narrowing.  RCA contains luminal irregularities diffusely with 25 to 30% mid to distal narrowing.  Normal LV function with EF 60% and LVEDP 14 mmHg RECOMMENDATIONS:  Aggressive multipronged primary prevention: LDL less than 70, tight blood pressure control, smoking cessation, weight loss, moderate aerobic activity, monitoring of glycemic control.  Consider sleep apnea.   Procedures Procedures (including critical care time)  Medications Ordered in ED Medications - No data to display  ED Course  I have reviewed the triage vital signs and the nursing notes.  Pertinent labs & imaging results that were available during my care of the patient were reviewed by me and considered in my medical decision making (see chart for details).    MDM Rules/Calculators/A&P                      Patient has minor allergic reaction to the adhesive Tegaderm on his right wrist.  There is no evidence of infection.  The dressing has been in place for over 36 hours, there is no weeping, oozing, or discharge  from the catheterization site. Final Clinical Impression(s) / ED Diagnoses Final diagnoses:  Allergic contact dermatitis due to adhesives    Rx / DC Orders ED Discharge Orders    None       Roxy Horseman, PA-C 01/26/20 0013    Mesner, Barbara Cower, MD 01/26/20 0028

## 2020-01-30 NOTE — Progress Notes (Signed)
Cardiology Office Note   Date:  02/06/2020   ID:  [name], DOB Feb 03, 1951, MRN [id number]  PCP:  Sharilyn Sites, MD  Cardiologist:   Jenkins Rouge, MD   No chief complaint on file.     History of Present Illness: Harold Brady is a 69 y.o. 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 Chantix caused bad dreams and put on Celexa Discussed using nicotine patches     Past Medical History:  Diagnosis Date  . HTN (hypertension)   . Hyperlipemia   . Pericardial effusion   . Periodontal disease   . Plantar fasciitis     Past Surgical History:  Procedure Laterality Date  . BACK SURGERY     L4,L5  . LEFT HEART CATH AND CORONARY ANGIOGRAPHY N/A 01/24/2020   Procedure: LEFT HEART CATH AND CORONARY ANGIOGRAPHY;  Surgeon: Belva Crome, MD;  Location: [place] [facility];  Service: Cardiovascular;  Laterality: N/A;     Current Outpatient Medications  Medication Sig Dispense Refill  . carvedilol (COREG) 3.125 MG tablet Take 1 tablet (3.125 mg total) by mouth 2 (two) times daily with a meal. 180 tablet 3  . citalopram (CELEXA) 20 MG tablet Take 20 mg by mouth at  bedtime.    Marland Kitchen ezetimibe (ZETIA) 10 MG tablet Take 10 mg by mouth at bedtime.    Marland Kitchen ibuprofen (ADVIL) 200 MG tablet Take 400 mg by mouth every 8 (eight) hours as needed (pain.).    Marland Kitchen Multiple Vitamin (MULTIVITAMIN WITH MINERALS) TABS tablet Take 1 tablet by mouth 2 (two) times a week.    . olmesartan (BENICAR) 40 MG tablet Take 40 mg by mouth at bedtime.    . Omega-3 Fatty Acids (FISH OIL) 1000 MG CAPS Take 1,000 mg by mouth in the morning and at bedtime.    . rosuvastatin (CRESTOR) 10 MG tablet Take 10 mg by mouth at bedtime.     Current Facility-Administered Medications  Medication Dose Route Frequency Provider Last Rate Last Admin  . sodium chloride flush (NS) 0.9 % injection 3 mL  3 mL Intravenous Q12H Josue Hector, MD        Allergies:   Penicillins, Procaine, Lipitor [atorvastatin calcium], and Niaspan [niacin er]    Social History:  The patient  reports that he has been smoking cigarettes. He started smoking about 54 years ago. He has a 40.00 pack-year smoking history. He has never used smokeless tobacco. He reports current alcohol use.   Family History:  The patient's family history includes Cancer in his father and mother; Coronary artery disease  in his mother; Hypertension in his brother and father.    ROS:  Please see the history of present illness.   Otherwise, review of systems are positive for none.   All other systems are reviewed and negative.    PHYSICAL EXAM: VS:  BP 125/77   Ht 6\' 2"  (1.88 m)   Wt 230 lb (104.3 kg)   BMI 29.53 kg/m  , BMI Body mass index is 29.53 kg/m.  Overweight No distress No tachypnea No JVP elevation LE varicosities Right radial cath site ok     EKG:  2012 SR rate 69 LAFB normal ST segments  09/09/16  SR LAFB otherwise normal 12/14/19 SR LAD normal    Recent Labs: 01/18/2020: ALT 18; BUN 9; Creatinine, Ser 0.92; Hemoglobin 16.2; Platelets 267; Potassium 4.5; Sodium 142    Lipid Panel    Component Value Date/Time   CHOL 114  01/18/2020 1138   TRIG 99 01/18/2020 1138   HDL 36 (L) 01/18/2020 1138   CHOLHDL 3.2 01/18/2020 1138   CHOLHDL 4 08/26/2011 1116   VLDL 13.6 08/26/2011 1116   LDLCALC 59 01/18/2020 1138      Wt Readings from Last 3 Encounters:  02/06/20 230 lb (104.3 kg)  01/24/20 230 lb (104.3 kg)  01/19/20 230 lb (104.3 kg)      Other studies Reviewed: Additional studies/ records that were reviewed today include: Notes Dr 01/21/20 2007 Primary care notes CT scan 08/2015 [place] .    ASSESSMENT AND PLAN:  1. CAD :  Cath 01/24/20 moderate severe 2 VD circumflex/LAD Normal EF medical Rx per Dr 01/26/20  2. HTN   Improved with coreg and ARB  3. Chol continue crestor and zetia Target LDL < 70 was 59 on 01/18/20  4. Smoking:  CT with no asbestosis or cancer counseled for less than 10 minutes and discussed using nicotine patches 5. Varicose Veins with neuropathic pain f/u [facility] duplex 11/17/18 did not appear to show connection to saphenous vein  Current medicines are reviewed at length with the patient today.  The patient does not have concerns regarding medicines.  The following changes have been made:  None   Labs/ tests ordered today include: None   No orders of the defined types were placed in this encounter.    Disposition:   FU with me in 6 months and consider stress testing     Signed, 11/19/18, MD  02/06/2020 8:31 AM    [facility] [address] [name], [place], [place]  [place] Phone: [phone]; Fax: [phone]

## 2020-02-06 ENCOUNTER — Encounter: Payer: Self-pay | Admitting: Cardiovascular Disease

## 2020-02-06 ENCOUNTER — Telehealth (INDEPENDENT_AMBULATORY_CARE_PROVIDER_SITE_OTHER): Payer: Medicare Other | Admitting: Cardiovascular Disease

## 2020-02-06 VITALS — BP 125/77 | Ht 74.0 in | Wt 230.0 lb

## 2020-02-06 DIAGNOSIS — I2510 Atherosclerotic heart disease of native coronary artery without angina pectoris: Secondary | ICD-10-CM | POA: Diagnosis not present

## 2020-02-06 MED ORDER — NITROGLYCERIN 0.4 MG SL SUBL: 0.4000 mg | SUBLINGUAL_TABLET | SUBLINGUAL | 3 refills | Status: AC | PRN

## 2020-02-06 NOTE — Patient Instructions (Addendum)
Medication Instructions:  Your physician has recommended you make the following change in your medication:  1- Take 1 Nitroglycerin (NTG), under your tongue, while sitting. If no relief of pain may repeat NTG, one tab every 5 minutes up to 3 tablets total over 15 minutes. If no relief CALL 911. If you have dizziness/lightheadness while taking NTG, stop taking and call 911.  *If you need a refill on your cardiac medications before your next appointment, please call your pharmacy*  Lab Work: If you have labs (blood work) drawn today and your tests are completely normal, you will receive your results only by: Marland Kitchen MyChart Message (if you have MyChart) OR . A paper copy in the mail If you have any lab test that is abnormal or we need to change your treatment, we will call you to review the results.  Testing/Procedures: None ordered today.  Follow-Up: At [facility], you and your health needs are our priority.  As part of our continuing mission to provide you with exceptional heart care, we have created designated Provider Care Teams.  These Care Teams include your primary Cardiologist (physician) and Advanced Practice Providers (APPs -  Physician Assistants and Nurse Practitioners) who all work together to provide you with the care you need, when you need it.  We recommend signing up for the patient portal called "MyChart".  Sign up information is provided on this After Visit Summary.  MyChart is used to connect with patients for Virtual Visits (Telemedicine).  Patients are able to view lab/test results, encounter notes, upcoming appointments, etc.  Non-urgent messages can be sent to your provider as well.   To learn more about what you can do with MyChart, go to [email or web address].    Your next appointment:   6 month(s)  The format for your next appointment:   In Person  Provider:   Charlton Haws, MD

## 2020-03-26 DIAGNOSIS — E7849 Other hyperlipidemia: Secondary | ICD-10-CM | POA: Diagnosis not present

## 2020-03-26 DIAGNOSIS — I10 Essential (primary) hypertension: Secondary | ICD-10-CM | POA: Diagnosis not present

## 2020-04-03 ENCOUNTER — Telehealth: Payer: Self-pay | Admitting: Cardiovascular Disease

## 2020-04-03 MED ORDER — CARVEDILOL 3.125 MG PO TABS: 3.1250 mg | ORAL_TABLET | Freq: Two times a day (BID) | ORAL | 2 refills | Status: DC

## 2020-04-03 NOTE — Telephone Encounter (Signed)
Pt's medication was sent to pt's pharmacy as requested. Confirmation received.  °

## 2020-04-03 NOTE — Telephone Encounter (Signed)
New message                *STAT* If patient is at the pharmacy, call can be transferred to refill team.   1. Which medications need to be refilled? (please list name of each medication and dose if known)       carvedilol (COREG) 3.125 MG tablet      2. Which pharmacy/location (including street and city if local pharmacy) is medication to be sent to?[facility] [address] , [place], [place]  3. Do they need a 30 day or 90 day supply? 90 day

## 2020-04-25 DIAGNOSIS — E7849 Other hyperlipidemia: Secondary | ICD-10-CM | POA: Diagnosis not present

## 2020-04-25 DIAGNOSIS — I10 Essential (primary) hypertension: Secondary | ICD-10-CM | POA: Diagnosis not present

## 2020-05-25 DIAGNOSIS — I10 Essential (primary) hypertension: Secondary | ICD-10-CM | POA: Diagnosis not present

## 2020-05-25 DIAGNOSIS — Z720 Tobacco use: Secondary | ICD-10-CM | POA: Diagnosis not present

## 2020-05-25 DIAGNOSIS — E7849 Other hyperlipidemia: Secondary | ICD-10-CM | POA: Diagnosis not present

## 2020-08-20 NOTE — Progress Notes (Signed)
Cardiology Office Note   Date:  08/22/2020   ID:  Harold Brady 08-07-1951, MRN [id number]  PCP:  Assunta Found, Brady  Cardiologist:   Charlton Haws, Brady   No chief complaint on file.     History of Present Illness: Harold Brady is a 69 y.o. 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 Chantix caused bad dreams and put on Celexa Discussed using nicotine patches   Has a friend at [employer] with recurrent lymphoma   Past Medical History:  Diagnosis Date  . HTN (hypertension)   . Hyperlipemia   . Pericardial effusion   . Periodontal disease   . Plantar fasciitis     Past Surgical History:  Procedure Laterality Date  . BACK SURGERY     L4,L5  . LEFT HEART CATH AND CORONARY ANGIOGRAPHY N/A 01/24/2020   Procedure: LEFT HEART CATH AND CORONARY ANGIOGRAPHY;  Surgeon: Lyn Records, Brady;  Location: [facility];  Service: Cardiovascular;  Laterality: N/A;     Current Outpatient Medications  Medication Sig Dispense Refill  . Acetaminophen (TYLENOL EXTRA STRENGTH PO) Take 500 mg by mouth as needed.    . carvedilol (COREG) 3.125 MG tablet Take 1 tablet  (3.125 mg total) by mouth 2 (two) times daily with a meal. 180 tablet 2  . citalopram (CELEXA) 20 MG tablet Take 20 mg by mouth at bedtime.    Marland Kitchen ezetimibe (ZETIA) 10 MG tablet Take 10 mg by mouth at bedtime.    . Multiple Vitamin (MULTIVITAMIN WITH MINERALS) TABS tablet Take 1 tablet by mouth 2 (two) times a week.    . olmesartan (BENICAR) 40 MG tablet Take 40 mg by mouth at bedtime.    . Omega-3 Fatty Acids (FISH OIL) 1000 MG CAPS Take 1,000 mg by mouth in the morning and at bedtime.    . rosuvastatin (CRESTOR) 10 MG tablet Take 10 mg by mouth at bedtime.    . nitroGLYCERIN (NITROSTAT) 0.4 MG SL tablet Place 1 tablet (0.4 mg total) under the tongue every 5 (five) minutes as needed for chest pain. Up to 3 tablets 5 minutes apart. 25 tablet 3   Current Facility-Administered Medications  Medication Dose Route Frequency Provider Last Rate Last Admin  . sodium chloride flush (NS) 0.9 % injection 3 mL  3 mL Intravenous Q12H Harold Brady        Allergies:   Penicillins, Procaine, Lipitor [atorvastatin calcium], and Niaspan [niacin er]    Social History:  The patient  reports that he has been smoking cigarettes. He started smoking about 54  years ago. He has a 40.00 pack-year smoking history. He has never used smokeless tobacco. He reports current alcohol use.   Family History:  The patient's family history includes Cancer in his father and mother; Coronary artery disease in his mother; Hypertension in his brother and father.    ROS:  Please see the history of present illness.   Otherwise, review of systems are positive for none.   All other systems are reviewed and negative.    PHYSICAL EXAM: VS:  BP 116/82   Pulse 67   Ht 6\' 2"  (1.88 m)   Wt 226 lb 12.8 oz (102.9 kg)   SpO2 94%   BMI 29.12 kg/m  , BMI Body mass index is 29.12 kg/m.  Affect appropriate Healthy:  appears stated age HEENT: normal Neck supple with no adenopathy JVP normal no bruits no thyromegaly Lungs clear with  no wheezing and good diaphragmatic motion Heart:  S1/S2 no murmur, no rub, gallop or click PMI normal Abdomen: benighn, BS positve, no tenderness, no AAA no bruit.  No HSM or HJR Distal pulses intact with no bruits Varicosities with bilateral edema Neuro non-focal Skin warm and dry No muscular weakness   EKG:  2012 SR rate 69 LAFB normal ST segments  09/09/16  SR LAFB otherwise normal 12/14/19 SR LAD normal    Recent Labs: 01/18/2020: ALT 18; BUN 9; Creatinine, Ser 0.92; Hemoglobin 16.2; Platelets 267; Potassium 4.5; Sodium 142    Lipid Panel    Component Value Date/Time   CHOL 114 01/18/2020 1138   TRIG 99 01/18/2020 1138   HDL 36 (L) 01/18/2020 1138   CHOLHDL 3.2 01/18/2020 1138   CHOLHDL 4 08/26/2011 1116   VLDL 13.6 08/26/2011 1116   LDLCALC 59 01/18/2020 1138      Wt Readings from Last 3 Encounters:  08/22/20 226 lb 12.8 oz (102.9 kg)  02/06/20 230 lb (104.3 kg)  01/24/20 230 lb (104.3 kg)      Other studies Reviewed: Additional studies/ records that were reviewed today include: Notes Dr 01/26/20 2007 Primary care notes CT scan 08/2015 [place] .    ASSESSMENT AND PLAN:  1. CAD :  Cath 01/24/20 moderate severe 2 VD circumflex/LAD Normal EF medical Rx per Dr 01/26/20   2. HTN   Improved with coreg and ARB  3. Chol continue crestor and zetia Target LDL < 70 was 59 on 01/18/20  4. Smoking:  CT with no asbestosis or cancer counseled for less than 10 minutes and discussed using nicotine patches 5. Varicose Veins with neuropathic pain f/u [facility] duplex 11/17/18 did not appear to show connection to saphenous vein  Current medicines are reviewed at length with the patient today.  The patient does not have concerns regarding medicines.  The following changes have been made:  None   Labs/ tests ordered today include: None   No orders of the defined types were placed in this encounter.    Disposition:   FU with me in a year     Signed, 11/19/18, Brady  08/22/2020  9:31 AM    [facility] [address] [place], [place], [place]  [place] Phone: [phone]; Fax: [phone]

## 2020-08-22 ENCOUNTER — Other Ambulatory Visit: Payer: Self-pay

## 2020-08-22 ENCOUNTER — Ambulatory Visit: Payer: Medicare Other | Admitting: Cardiovascular Disease

## 2020-08-22 ENCOUNTER — Encounter: Payer: Self-pay | Admitting: Cardiovascular Disease

## 2020-08-22 VITALS — BP 116/82 | HR 67 | Ht 74.0 in | Wt 226.8 lb

## 2020-08-22 DIAGNOSIS — I2510 Atherosclerotic heart disease of native coronary artery without angina pectoris: Secondary | ICD-10-CM | POA: Diagnosis not present

## 2020-08-22 NOTE — Patient Instructions (Signed)

## 2020-09-25 DIAGNOSIS — E7849 Other hyperlipidemia: Secondary | ICD-10-CM | POA: Diagnosis not present

## 2020-09-25 DIAGNOSIS — I10 Essential (primary) hypertension: Secondary | ICD-10-CM | POA: Diagnosis not present

## 2020-11-24 DIAGNOSIS — I10 Essential (primary) hypertension: Secondary | ICD-10-CM | POA: Diagnosis not present

## 2020-11-24 DIAGNOSIS — E7849 Other hyperlipidemia: Secondary | ICD-10-CM | POA: Diagnosis not present

## 2020-12-16 ENCOUNTER — Other Ambulatory Visit: Payer: Self-pay | Admitting: Cardiovascular Disease

## 2020-12-24 DIAGNOSIS — I10 Essential (primary) hypertension: Secondary | ICD-10-CM | POA: Diagnosis not present

## 2020-12-24 DIAGNOSIS — E7849 Other hyperlipidemia: Secondary | ICD-10-CM | POA: Diagnosis not present

## 2021-01-31 DIAGNOSIS — Z1389 Encounter for screening for other disorder: Secondary | ICD-10-CM | POA: Diagnosis not present

## 2021-01-31 DIAGNOSIS — F17200 Nicotine dependence, unspecified, uncomplicated: Secondary | ICD-10-CM | POA: Diagnosis not present

## 2021-01-31 DIAGNOSIS — Z0000 Encounter for general adult medical examination without abnormal findings: Secondary | ICD-10-CM | POA: Diagnosis not present

## 2021-01-31 DIAGNOSIS — F17210 Nicotine dependence, cigarettes, uncomplicated: Secondary | ICD-10-CM | POA: Diagnosis not present

## 2021-01-31 DIAGNOSIS — I10 Essential (primary) hypertension: Secondary | ICD-10-CM | POA: Diagnosis not present

## 2021-01-31 DIAGNOSIS — I2510 Atherosclerotic heart disease of native coronary artery without angina pectoris: Secondary | ICD-10-CM | POA: Diagnosis not present

## 2021-01-31 DIAGNOSIS — E7849 Other hyperlipidemia: Secondary | ICD-10-CM | POA: Diagnosis not present

## 2021-01-31 DIAGNOSIS — R7309 Other abnormal glucose: Secondary | ICD-10-CM | POA: Diagnosis not present

## 2021-01-31 DIAGNOSIS — G2581 Restless legs syndrome: Secondary | ICD-10-CM | POA: Diagnosis not present

## 2021-02-23 DIAGNOSIS — E7849 Other hyperlipidemia: Secondary | ICD-10-CM | POA: Diagnosis not present

## 2021-02-23 DIAGNOSIS — I10 Essential (primary) hypertension: Secondary | ICD-10-CM | POA: Diagnosis not present

## 2021-05-26 DIAGNOSIS — E7849 Other hyperlipidemia: Secondary | ICD-10-CM | POA: Diagnosis not present

## 2021-05-26 DIAGNOSIS — I10 Essential (primary) hypertension: Secondary | ICD-10-CM | POA: Diagnosis not present

## 2021-07-26 DIAGNOSIS — I10 Essential (primary) hypertension: Secondary | ICD-10-CM | POA: Diagnosis not present

## 2021-07-26 DIAGNOSIS — E782 Mixed hyperlipidemia: Secondary | ICD-10-CM | POA: Diagnosis not present

## 2021-07-30 ENCOUNTER — Other Ambulatory Visit: Payer: Self-pay | Admitting: Cardiovascular Disease

## 2021-08-22 DIAGNOSIS — H524 Presbyopia: Secondary | ICD-10-CM | POA: Diagnosis not present

## 2021-08-22 DIAGNOSIS — H4911 Fourth [trochlear] nerve palsy, right eye: Secondary | ICD-10-CM | POA: Diagnosis not present

## 2021-10-01 DIAGNOSIS — I2510 Atherosclerotic heart disease of native coronary artery without angina pectoris: Secondary | ICD-10-CM | POA: Insufficient documentation

## 2021-10-01 HISTORY — DX: Atherosclerotic heart disease of native coronary artery without angina pectoris: I25.10

## 2021-10-01 NOTE — Progress Notes (Signed)
Cardiology Office Note:    Date:  10/02/2021   ID:  Harold Brady, DOB [birth year], MRN [id number]  PCP:  Assunta Found, MD   [facility] Providers Cardiologist:  Charlton Haws, MD    Referring MD: Assunta Found, MD   Chief Complaint:  F/u for CAD    Patient Profile:   Harold Brady is a 70 y.o. male with:  Coronary artery disease  CAC score in 3/21: 1312 (93 rd percentile) ETT 3/21: abnormal Cath 3/21: mod LAD and LCx disease - Med Rx  Hypertension  Hyperlipidemia  +Cigs  Venous insufficiency  ABIs in 11/19: normal  History of Present Illness: Harold Brady was last seen by Dr. Eden Emms in 10/21.  He returns for f/u.  He is here today with his wife.  Since last seen, he has not had chest pain.  He did have some shortness of breath pulling a deer out of the woods recently.  His wife noted that he was significantly short of breath with this.  He does get dyspnea with exertion at times.  Overall, he does not seem to think that his shortness of breath is getting any worse.  He has not had syncope, orthopnea, leg edema.      ASSESSMENT & PLAN:   Shortness of breath He has noted some shortness of breath recently.  He has not had chest pain.  Etiology of his shortness of breath is not entirely clear.  He does continue to smoke.  I suspect his shortness of breath is more related to underlying lung disease from smoking than anything else.  At this point, I do not think he needs follow-up stress testing.  I have recommended proceeding with an echocardiogram to reassess LV function as well as assess pulmonary pressures.  Follow-up with Dr. Eden Emms in 6 months.  He knows to return sooner if he has worsening symptoms.  Moderate 2v Coronary Artery Disease  Moderate two-vessel CAD on recent cardiac catheterization in March 2021.  As noted, he is not having chest pain to suggest angina.  Proceed with echocardiogram to evaluate shortness of breath as outlined above.  Continue carvedilol 3.125 mg  twice daily, ezetimibe 10 mg daily, rosuvastatin 10 mg daily.  Follow-up in 6 months.  Hyperlipemia LDL in 01/2021 was 83.  Goal is <70.  I have recommended increasing rosuvastatin to 20 mg daily.  Continue ezetimibe 10 mg daily.  Arrange fasting lipids and LFTs in 3 months.  HTN (hypertension) Blood pressure somewhat elevated today.  I have asked him to monitor his blood pressure over the next couple weeks and send me readings for review.  Continue carvedilol 3.125 mg twice daily, olmesartan 40 mg daily.  If blood pressure is above target (<130/80), I will add amlodipine.  Tobacco abuse We discussed the importance of quitting smoking.            Dispo:  Return in about 6 months (around 04/02/2022) for Routine follow up in 6 months with Dr. Eden Emms. .    Prior CV studies: Cardiac catheterization 02/07/20 LAD prox 65, mid 70 LCx ost 40, prox 50, mid 75 RCA mid 25 EF 55-65   Echocardiogram 08/10/2006 Normal EF, mild LVH, Gr 1 DD, trace MR, trace TR, very small pericardial effusion     Past Medical History:  Diagnosis Date   HTN (hypertension)    Hyperlipemia    Pericardial effusion    Periodontal disease    Plantar fasciitis    Current Medications: Current Meds  Medication Sig   Acetaminophen (TYLENOL EXTRA STRENGTH PO) Take 500 mg by mouth as needed.   carvedilol (COREG) 3.125 MG tablet TAKE ONE TABLET BY MOUTH TWICE DAILY   citalopram (CELEXA) 20 MG tablet Take 20 mg by mouth at bedtime.   diclofenac (VOLTAREN) 75 MG EC tablet Take 75 mg by mouth 2 (two) times daily as needed for mild pain or moderate pain.   ezetimibe (ZETIA) 10 MG tablet Take 10 mg by mouth at bedtime.   Multiple Vitamin (MULTIVITAMIN WITH MINERALS) TABS tablet Take 1 tablet by mouth 2 (two) times a week.   nitroGLYCERIN (NITROSTAT) 0.4 MG SL tablet Place 1 tablet (0.4 mg total) under the tongue every 5 (five) minutes as needed for chest pain. Up to 3 tablets 5 minutes apart.   olmesartan (BENICAR) 40 MG  tablet Take 40 mg by mouth at bedtime.   Omega-3 Fatty Acids (FISH OIL) 1000 MG CAPS Take 1,000 mg by mouth in the morning and at bedtime.   [DISCONTINUED] rosuvastatin (CRESTOR) 10 MG tablet Take 10 mg by mouth at bedtime.   Current Facility-Administered Medications for the 10/02/21 encounter (Office Visit) with Tereso Newcomer T, PA-C  Medication   sodium chloride flush (NS) 0.9 % injection 3 mL    Allergies:   Penicillins, Procaine, Lipitor [atorvastatin calcium], and Niaspan [niacin er]   Social History   Tobacco Use   Smoking status: Every Day    Packs/day: 1.00    Years: 40.00    Pack years: 40.00    Types: Cigarettes    Start date: 09/09/1965   Smokeless tobacco: Never  Vaping Use   Vaping Use: Never used  Substance Use Topics   Alcohol use: Yes    Comment: OCCASIONAL BEER    Family Hx: The patient's family history includes Cancer in his father and mother; Coronary artery disease in his mother; Hypertension in his brother and father.  ROS see HPI  EKGs/Labs/Other Test Reviewed:    EKG:  EKG is  ordered today.  The ekg ordered today demonstrates NSR, HR 66, inferior Q waves, no ST-T wave changes, QTC 402  Recent Labs: No results found for requested labs within last 8760 hours.   Recent Lipid Panel Lab Results  Component Value Date/Time   CHOL 114 01/18/2020 11:38 AM   TRIG 99 01/18/2020 11:38 AM   HDL 36 (L) 01/18/2020 11:38 AM   LDLCALC 59 01/18/2020 11:38 AM     Risk Assessment/Calculations:          Physical Exam:    VS:  BP 138/80   Pulse 66   Ht 6\' 2"  (1.88 m)   Wt 225 lb 12.8 oz (102.4 kg)   SpO2 97%   BMI 28.99 kg/m     Wt Readings from Last 3 Encounters:  10/02/21 225 lb 12.8 oz (102.4 kg)  08/22/20 226 lb 12.8 oz (102.9 kg)  02/06/20 230 lb (104.3 kg)    Constitutional:      Appearance: Healthy appearance. Not in distress.  Neck:     Vascular: No JVR. JVD normal.  Pulmonary:     Effort: Pulmonary effort is normal.     Breath sounds:  No wheezing. No rales.  Cardiovascular:     Normal rate. Regular rhythm. Normal S1. Normal S2.      Murmurs: There is no murmur.  Edema:    Peripheral edema absent.  Abdominal:     Palpations: Abdomen is soft. There is no hepatomegaly.  Skin:  General: Skin is warm and dry.  Neurological:     General: No focal deficit present.     Mental Status: Alert and oriented to person, place and time.     Cranial Nerves: Cranial nerves are intact.       Medication Adjustments/Labs and Tests Ordered: Current medicines are reviewed at length with the patient today.  Concerns regarding medicines are outlined above.  Tests Ordered: Orders Placed This Encounter  Procedures   Hepatic function panel   Lipid Profile   EKG 12-Lead   ECHOCARDIOGRAM COMPLETE    Medication Changes: Meds ordered this encounter  Medications   rosuvastatin (CRESTOR) 20 MG tablet    Sig: Take 1 tablet (20 mg total) by mouth at bedtime.    Dispense:  90 tablet    Refill:  3    Signed, Tereso Newcomer, PA-C  10/02/2021 4:24 PM    [facility] [address] [place], [place], [place]  [address] Phone: [phone]; Fax: [phone]

## 2021-10-02 ENCOUNTER — Other Ambulatory Visit: Payer: Self-pay

## 2021-10-02 ENCOUNTER — Encounter: Payer: Self-pay | Admitting: Physician Assistant

## 2021-10-02 ENCOUNTER — Ambulatory Visit: Payer: Medicare Other | Admitting: Physician Assistant

## 2021-10-02 VITALS — BP 138/80 | HR 66 | Ht 74.0 in | Wt 225.8 lb

## 2021-10-02 DIAGNOSIS — E782 Mixed hyperlipidemia: Secondary | ICD-10-CM

## 2021-10-02 DIAGNOSIS — I10 Essential (primary) hypertension: Secondary | ICD-10-CM | POA: Diagnosis not present

## 2021-10-02 DIAGNOSIS — Z720 Tobacco use: Secondary | ICD-10-CM | POA: Diagnosis not present

## 2021-10-02 DIAGNOSIS — R0602 Shortness of breath: Secondary | ICD-10-CM

## 2021-10-02 DIAGNOSIS — I2510 Atherosclerotic heart disease of native coronary artery without angina pectoris: Secondary | ICD-10-CM

## 2021-10-02 MED ORDER — ROSUVASTATIN CALCIUM 20 MG PO TABS: 20.0000 mg | ORAL_TABLET | Freq: Every day | ORAL | 3 refills | Status: DC

## 2021-10-02 NOTE — Assessment & Plan Note (Signed)
Moderate two-vessel CAD on recent cardiac catheterization in March 2021.  As noted, he is not having chest pain to suggest angina.  Proceed with echocardiogram to evaluate shortness of breath as outlined above.  Continue carvedilol 3.125 mg twice daily, ezetimibe 10 mg daily, rosuvastatin 10 mg daily.  Follow-up in 6 months.

## 2021-10-02 NOTE — Assessment & Plan Note (Signed)
We discussed the importance of quitting smoking. 

## 2021-10-02 NOTE — Patient Instructions (Signed)
Medication Instructions:   INCREASE Crestor one (1) tablet by mouth ( 20 mg) daily.  *If you need a refill on your cardiac medications before your next appointment, please call your pharmacy*   Lab Work:  Your physician recommends that you return for a FASTING lipid profile/lft. Tuesday, March 21. You can come in on the day of your appointment anytime between 7:30-4:30 fasting from midnight the night before.    If you have labs (blood work) drawn today and your tests are completely normal, you will receive your results only by: MyChart Message (if you have MyChart) OR A paper copy in the mail If you have any lab test that is abnormal or we need to change your treatment, we will call you to review the results.   Testing/Procedures:  Your physician has requested that you have an echocardiogram. Echocardiography is a painless test that uses sound waves to create images of your heart. It provides your doctor with information about the size and shape of your heart and how well your heart's chambers and valves are working. This procedure takes approximately one hour. There are no restrictions for this procedure.    Follow-Up: At [facility], you and your health needs are our priority.  As part of our continuing mission to provide you with exceptional heart care, we have created designated Provider Care Teams.  These Care Teams include your primary Cardiologist (physician) and Advanced Practice Providers (APPs -  Physician Assistants and Nurse Practitioners) who all work together to provide you with the care you need, when you need it.  We recommend signing up for the patient portal called "MyChart".  Sign up information is provided on this After Visit Summary.  MyChart is used to connect with patients for Virtual Visits (Telemedicine).  Patients are able to view lab/test results, encounter notes, upcoming appointments, etc.  Non-urgent messages can be sent to your provider as well.   To  learn more about what you can do with MyChart, go to [email or web address].    Your next appointment:   6 month(s)  The format for your next appointment:   In Person  Provider:   None     Other Instructions  Please send in either mychart or call  @ [phone] with Blood Pressure readings in two weeks.

## 2021-10-02 NOTE — Assessment & Plan Note (Signed)
LDL in 01/2021 was 83.  Goal is <70.  I have recommended increasing rosuvastatin to 20 mg daily.  Continue ezetimibe 10 mg daily.  Arrange fasting lipids and LFTs in 3 months.

## 2021-10-02 NOTE — Assessment & Plan Note (Signed)
Blood pressure somewhat elevated today.  I have asked him to monitor his blood pressure over the next couple weeks and send me readings for review.  Continue carvedilol 3.125 mg twice daily, olmesartan 40 mg daily.  If blood pressure is above target (<130/80), I will add amlodipine.

## 2021-10-02 NOTE — Assessment & Plan Note (Signed)
He has noted some shortness of breath recently.  He has not had chest pain.  Etiology of his shortness of breath is not entirely clear.  He does continue to smoke.  I suspect his shortness of breath is more related to underlying lung disease from smoking than anything else.  At this point, I do not think he needs follow-up stress testing.  I have recommended proceeding with an echocardiogram to reassess LV function as well as assess pulmonary pressures.  Follow-up with Dr. Eden Emms in 6 months.  He knows to return sooner if he has worsening symptoms.

## 2021-10-25 ENCOUNTER — Other Ambulatory Visit: Payer: Self-pay | Admitting: Cardiovascular Disease

## 2021-11-05 ENCOUNTER — Ambulatory Visit (HOSPITAL_COMMUNITY): Payer: Medicare Other | Attending: Cardiology

## 2021-11-05 ENCOUNTER — Other Ambulatory Visit: Payer: Self-pay

## 2021-11-05 ENCOUNTER — Encounter: Payer: Self-pay | Admitting: Physician Assistant

## 2021-11-05 DIAGNOSIS — I10 Essential (primary) hypertension: Secondary | ICD-10-CM | POA: Insufficient documentation

## 2021-11-05 DIAGNOSIS — I2510 Atherosclerotic heart disease of native coronary artery without angina pectoris: Secondary | ICD-10-CM | POA: Diagnosis not present

## 2021-11-05 DIAGNOSIS — E782 Mixed hyperlipidemia: Secondary | ICD-10-CM

## 2021-11-05 LAB — ECHOCARDIOGRAM COMPLETE
Area-P 1/2: 3.17 cm2
S' Lateral: 3.3 cm

## 2021-11-26 DIAGNOSIS — E782 Mixed hyperlipidemia: Secondary | ICD-10-CM | POA: Diagnosis not present

## 2021-11-26 DIAGNOSIS — I10 Essential (primary) hypertension: Secondary | ICD-10-CM | POA: Diagnosis not present

## 2022-01-14 ENCOUNTER — Other Ambulatory Visit: Payer: Self-pay

## 2022-01-14 ENCOUNTER — Other Ambulatory Visit: Payer: Medicare Other | Admitting: *Deleted

## 2022-01-14 DIAGNOSIS — I2510 Atherosclerotic heart disease of native coronary artery without angina pectoris: Secondary | ICD-10-CM

## 2022-01-14 DIAGNOSIS — I10 Essential (primary) hypertension: Secondary | ICD-10-CM

## 2022-01-14 DIAGNOSIS — E782 Mixed hyperlipidemia: Secondary | ICD-10-CM

## 2022-01-14 LAB — HEPATIC FUNCTION PANEL
ALT: 13 IU/L (ref 0–44)
AST: 19 IU/L (ref 0–40)
Albumin: 3.8 g/dL (ref 3.8–4.8)
Alkaline Phosphatase: 96 IU/L (ref 44–121)
Bilirubin Total: 0.5 mg/dL (ref 0.0–1.2)
Bilirubin, Direct: 0.13 mg/dL (ref 0.00–0.40)
Total Protein: 6.3 g/dL (ref 6.0–8.5)

## 2022-01-14 LAB — LIPID PANEL
Chol/HDL Ratio: 4.7 ratio (ref 0.0–5.0)
Cholesterol, Total: 175 mg/dL (ref 100–199)
HDL: 37 mg/dL — ABNORMAL LOW (ref >39)
LDL Chol Calc (NIH): 110 mg/dL — ABNORMAL HIGH (ref 0–99)
Triglycerides: 156 mg/dL — ABNORMAL HIGH (ref 0–149)
VLDL Cholesterol Cal: 28 mg/dL (ref 5–40)

## 2022-01-15 ENCOUNTER — Telehealth: Payer: Self-pay | Admitting: *Deleted

## 2022-01-15 DIAGNOSIS — E782 Mixed hyperlipidemia: Secondary | ICD-10-CM

## 2022-01-15 MED ORDER — ROSUVASTATIN CALCIUM 40 MG PO TABS: 40.0000 mg | ORAL_TABLET | Freq: Every day | ORAL | 3 refills | Status: AC

## 2022-01-15 NOTE — Telephone Encounter (Signed)
-----   Message from Beatrice Lecher, [place] sent at 01/15/2022 11:12 AM EDT ----- ?LDL increased and above goal.  Goal LDL <70.  LFTs normal. ?PLAN:  ?-Ask patient if he is still taking rosuvastatin 20 mg daily and ezetimibe 10 mg daily ?-If yes, increase rosuvastatin to 40 mg daily and repeat lipids, LFTs in 3 months ?-If no because of side effects, refer to Pharm.[facility]. lipid clinic for consideration of PCSK9 inhibitor ?Tereso Newcomer, PA-C    ?01/15/2022 11:11 AM   ?

## 2022-01-24 DIAGNOSIS — I10 Essential (primary) hypertension: Secondary | ICD-10-CM | POA: Diagnosis not present

## 2022-01-24 DIAGNOSIS — E782 Mixed hyperlipidemia: Secondary | ICD-10-CM | POA: Diagnosis not present

## 2022-03-13 NOTE — Progress Notes (Signed)
Cardiology Office Note:    Date:  03/14/2022   ID:  Harold Brady Wieting, DOB Oct 08, 1951, MRN [id number]  PCP:  Assunta FoundGolding, John, MD   [facility] Providers Cardiologist:  Charlton HawsPeter Nishan, MD     Referring MD: Assunta FoundGolding, John, MD   Chief Complaint: left leg swelling  History of Present Illness:    Harold Brady Raker is a very pleasant [age] y.o. male with a hx of   Coronary artery disease  CAC score in 3/21: 1312 (93 rd percentile) ETT 3/21: abnormal Cath 3/21: mod LAD and LCx disease - Med Rx  Hypertension  Hyperlipidemia  +Cigs  Venous insufficiency  ABIs in 11/19: normal  He was last seen in our office on 10/02/2021 by Tereso NewcomerScott Weaver, PA.  He reported recent shortness of breath, no chest pain.  Echocardiogram was ordered for evaluation and revealed normal LVEF 60 to 65%, no RWMA, mild LVH, G1 DD, mild calcification of the aortic valve with no evidence of stenosis.  Lipid panel in March 2023 revealed LDL 110, above goal of 70 and he was asked to increase rosuvastatin to 40 mg daily and continue ezetimibe 10 mg daily.    Today, he is here with his wife for evaluation of left lower extremity edema for 1 week since returning from a fishing trip. He stood for a majority of the day fishing and driving a boat. Cooked most meals, but eats a moderately high sodium diet. Has occasional bilateral leg "burning." He has varicose veins in both calves. 9th day of not smoking. He denies chest pain, shortness of breath, fatigue, palpitations, melena, hematuria, hemoptysis, diaphoresis, weakness, presyncope, syncope, orthopnea, and PND.   Past Medical History:  Diagnosis Date   HTN (hypertension)    Echocardiogram 1/23: EF 60-65, no RWMA, mild LVH, Gr 1 DD, normal RVSF, AV sclerosis w/o AS   Hyperlipemia    Moderate 2v Coronary Artery Disease  10/01/2021   CAC score in 3/21: 1312 (93 rd percentile) // ETT 3/21: abnormal  // Cath 3/21: mod LAD and LCx disease - Med Rx    Pericardial effusion    Periodontal  disease    Plantar fasciitis     Past Surgical History:  Procedure Laterality Date   BACK SURGERY     L4,L5   LEFT HEART CATH AND CORONARY ANGIOGRAPHY N/A 01/24/2020   Procedure: LEFT HEART CATH AND CORONARY ANGIOGRAPHY;  Surgeon: Lyn RecordsSmith, Henry W, MD;  Location: [facility] INVASIVE CV LAB;  Service: Cardiovascular;  Laterality: N/A;    Current Medications: Current Meds  Medication Sig   Acetaminophen (TYLENOL EXTRA STRENGTH PO) Take 500 mg by mouth as needed.   carvedilol (COREG) 3.125 MG tablet TAKE ONE TABLET BY MOUTH TWICE DAILY   citalopram (CELEXA) 20 MG tablet Take 20 mg by mouth at bedtime.   diclofenac (VOLTAREN) 75 MG EC tablet Take 75 mg by mouth 2 (two) times daily as needed for mild pain or moderate pain.   ezetimibe (ZETIA) 10 MG tablet Take 10 mg by mouth at bedtime.   Multiple Vitamin (MULTIVITAMIN WITH MINERALS) TABS tablet Take 1 tablet by mouth 2 (two) times a week.   nitroGLYCERIN (NITROSTAT) 0.4 MG SL tablet Place 1 tablet (0.4 mg total) under the tongue every 5 (five) minutes as needed for chest pain. Up to 3 tablets 5 minutes apart.   olmesartan (BENICAR) 40 MG tablet Take 40 mg by mouth at bedtime.   Omega-3 Fatty Acids (FISH OIL) 1000 MG CAPS Take 1,000 mg by mouth  in the morning and at bedtime.   rosuvastatin (CRESTOR) 40 MG tablet Take 1 tablet (40 mg total) by mouth daily.   Current Facility-Administered Medications for the 03/14/22 encounter (Office Visit) with Lissa Hoard, Zachary George, NP  Medication   sodium chloride flush (NS) 0.9 % injection 3 mL     Allergies:   Penicillins, Procaine, Lipitor [atorvastatin calcium], and Niaspan [niacin er]   Social History   Socioeconomic History   Marital status: Married    Spouse name: Not on file   Number of children: Not on file   Years of education: Not on file   Highest education level: Not on file  Occupational History   Not on file  Tobacco Use   Smoking status: Every Day    Packs/day: 1.00    Years: 40.00     Pack years: 40.00    Types: Cigarettes    Start date: 09/09/1965   Smokeless tobacco: Never  Vaping Use   Vaping Use: Never used  Substance and Sexual Activity   Alcohol use: Yes    Comment: OCCASIONAL BEER   Drug use: Not on file   Sexual activity: Not on file  Other Topics Concern   Not on file  Social History Narrative   Not on file   Social Determinants of Health   Financial Resource Strain: Not on file  Food Insecurity: Not on file  Transportation Needs: Not on file  Physical Activity: Not on file  Stress: Not on file  Social Connections: Not on file     Family History: The patient's family history includes Cancer in his father and mother; Coronary artery disease in his mother; Hypertension in his brother and father.  ROS:   Please see the history of present illness.    +LLE edema +burning in bilateral legs All other systems reviewed and are negative.  Labs/Other Studies Reviewed:    The following studies were reviewed today:  Echo 11/05/21  1. Left ventricular ejection fraction, by estimation, is 60 to 65%. The  left ventricle has normal function. The left ventricle has no regional  wall motion abnormalities. There is mild left ventricular hypertrophy.  Left ventricular diastolic parameters  are consistent with Grade I diastolic dysfunction (impaired relaxation).   2. Right ventricular systolic function is normal. The right ventricular  size is normal.   3. The mitral valve is normal in structure. No evidence of mitral valve  regurgitation. No evidence of mitral stenosis.   4. The aortic valve is tricuspid. There is mild calcification of the  aortic valve. There is mild thickening of the aortic valve. Aortic valve  regurgitation is not visualized. Aortic valve sclerosis is present, with  no evidence of aortic valve stenosis.   5. The inferior vena cava is normal in size with greater than 50%  respiratory variability, suggesting right atrial pressure of 3  mmHg.    LHC 01/24/20  Cardiac catheterization 01/24/20 LAD prox 65, mid 70 LCx ost 40, prox 50, mid 75 RCA mid 25 EF 55-65    ETT 01/17/20  Blood pressure demonstrated a hypertensive response to exercise. Upsloping ST segment depression ST segment depression of 1 mm was noted during stress in the V5 and V6 leads.   Positive ETT with 1 mm upsloping ST depression in recovery Severely HTN response to exercise Patient only achieved 79% PMHR with stress Couplets / PVCls with stress and recovery    CT Cardiac Scoring 01/17/20  Coronary calcium score of 1312. This was 37  rd percentile for age and sex matched control.   Given how high score is would consider f/u perfusion imaging  Recent Labs: 01/14/2022: ALT 13  Recent Lipid Panel    Component Value Date/Time   CHOL 175 01/14/2022 1038   TRIG 156 (H) 01/14/2022 1038   HDL 37 (L) 01/14/2022 1038   CHOLHDL 4.7 01/14/2022 1038   CHOLHDL 4 08/26/2011 1116   VLDL 13.6 08/26/2011 1116   LDLCALC 110 (H) 01/14/2022 1038     Risk Assessment/Calculations:       Physical Exam:    VS:  BP 130/72   Pulse 68   Ht 6\' 2"  (1.88 m)   Wt 216 lb 9.6 oz (98.2 kg)   SpO2 93%   BMI 27.81 kg/m     Wt Readings from Last 3 Encounters:  03/14/22 216 lb 9.6 oz (98.2 kg)  10/02/21 225 lb 12.8 oz (102.4 kg)  08/22/20 226 lb 12.8 oz (102.9 kg)     GEN:  Well nourished, well developed in no acute distress HEENT: Normal NECK: No JVD; No carotid bruits CARDIAC: RRR, no murmurs, rubs, gallops RESPIRATORY:  Clear to auscultation without rales, wheezing or rhonchi  ABDOMEN: Soft, non-tender, non-distended MUSCULOSKELETAL:  LLE 2+ pitting edema; No deformity. 2+ pedal pulses, equal bilaterally SKIN: Warm and dry NEUROLOGIC:  Alert and oriented x 3 PSYCHIATRIC:  Normal affect   EKG:  EKG is not ordered today.    Diagnoses:    1. Left leg swelling   2. Claudication in peripheral vascular disease (HCC)   3. Coronary artery disease  involving native coronary artery of native heart without angina pectoris   4. Hyperlipidemia LDL goal <70   5. Essential hypertension   6. Tobacco abuse    Assessment and Plan:     Left lower leg swelling: LLE edema x 1 week without improvement. Recent travel, fishing trip, standing all day and long travel plus history of tobacco abuse.  We will get ultrasound to rule out DVT today. Encouraged leg elevation and low sodium diet. Do not favor using diuretics at this time. Patient agrees that he does not want to take medication if can be avoided. Advised he can use compression socks once DVT is ruled out.   Claudication: Burning sensation in bilateral lower extremities. Symptoms occur with walking and standing. Also has discomfort during the night when sleeping. Had normal ABIs 2019. We will repeat arterial studies to evaluate to evaluate for peripheral arterial disease.   CAD without angina: Moderate to moderately severe two-vessel coronary disease in the circumflex and LAD by cardiac catheterization 12/2019, medical management recommended.  He denies chest pain, dyspnea, or other symptoms concerning for angina.  No indication for further ischemic evaluation at this time.   Hypertension: BP is well controlled today. No changes to current medications.   Hyperlipidemia LDL goal < 70: LDL 110 on 01/2020.  He is scheduled for recheck June 26.  He would like to recheck lipids today, however I encouraged him to give his recently increased dose of statin more time to show improvement. Keep lab appointment for 04/21/22.  Tobacco abuse: Today is day 9 of not smoking.  He states he prefers to manage on his own. I congratulated him on this achievement. Complete cessation advised.    Disposition: Keep follow-up appointment with Dr. 04/23/22 6/28     Medication Adjustments/Labs and Tests Ordered: Current medicines are reviewed at length with the patient today.  Concerns regarding medicines are outlined  above.  Orders Placed This Encounter  Procedures   VAS [place] LOWER EXTREMITY VENOUS (DVT)   VAS [place] ABI WITH/WO TBI   VAS [place] LOWER EXTREMITY ARTERIAL DUPLEX   No orders of the defined types were placed in this encounter.   Patient Instructions  Medication Instructions:  Your physician recommends that you continue on your current medications as directed. Please refer to the Current Medication list given to you today.  *If you need a refill on your cardiac medications before your next appointment, please call your pharmacy*   Lab Work: None ordered  If you have labs (blood work) drawn today and your tests are completely normal, you will receive your results only by: MyChart Message (if you have MyChart) OR A paper copy in the mail If you have any lab test that is abnormal or we need to change your treatment, we will call you to review the results.   Testing/Procedures: Your physician has requested that you have a lower or upper extremity arterial duplex. This test is an ultrasound of the arteries in the legs or arms. It looks at arterial blood flow in the legs and arms. Allow one hour for Lower and Upper Arterial scans. There are no restrictions or special instructions   Your physician has requested that you have a lower or upper extremity venous duplex STAT. This test is an ultrasound of the veins in the legs or arms. It looks at venous blood flow that carries blood from the heart to the legs or arms. Allow one hour for a Lower Venous exam. Allow thirty minutes for an Upper Venous exam. There are no restrictions or special instructions.     Follow-Up: At [facility], you and your health needs are our priority.  As part of our continuing mission to provide you with exceptional heart care, we have created designated Provider Care Teams.  These Care Teams include your primary Cardiologist (physician) and Advanced Practice Providers (APPs -  Physician Assistants and Nurse Practitioners)  who all work together to provide you with the care you need, when you need it.  We recommend signing up for the patient portal called "MyChart".  Sign up information is provided on this After Visit Summary.  MyChart is used to connect with patients for Virtual Visits (Telemedicine).  Patients are able to view lab/test results, encounter notes, upcoming appointments, etc.  Non-urgent messages can be sent to your provider as well.   To learn more about what you can do with MyChart, go to [email or web address].    Your next appointment:   AS SCHEDULED  The format for your next appointment:     Provider:       Other Instructions   Important Information About Sugar         Signed, Levi Aland, NP  03/14/2022 8:55 AM    [facility]

## 2022-03-14 ENCOUNTER — Ambulatory Visit: Payer: Medicare Other | Admitting: Nurse Practitioner

## 2022-03-14 ENCOUNTER — Ambulatory Visit (HOSPITAL_COMMUNITY)
Admission: RE | Admit: 2022-03-14 | Discharge: 2022-03-14 | Disposition: A | Discharge status: Home / Self Care | Payer: Medicare Other | Source: Ambulatory Visit | Attending: Cardiovascular Disease | Admitting: Cardiovascular Disease

## 2022-03-14 ENCOUNTER — Encounter: Payer: Self-pay | Admitting: Nurse Practitioner

## 2022-03-14 VITALS — BP 130/72 | HR 68 | Ht 74.0 in | Wt 216.6 lb

## 2022-03-14 DIAGNOSIS — I739 Peripheral vascular disease, unspecified: Secondary | ICD-10-CM | POA: Insufficient documentation

## 2022-03-14 DIAGNOSIS — I10 Essential (primary) hypertension: Secondary | ICD-10-CM | POA: Diagnosis not present

## 2022-03-14 DIAGNOSIS — E785 Hyperlipidemia, unspecified: Secondary | ICD-10-CM

## 2022-03-14 DIAGNOSIS — M7989 Other specified soft tissue disorders: Secondary | ICD-10-CM | POA: Diagnosis not present

## 2022-03-14 DIAGNOSIS — Z720 Tobacco use: Secondary | ICD-10-CM | POA: Diagnosis not present

## 2022-03-14 DIAGNOSIS — I2510 Atherosclerotic heart disease of native coronary artery without angina pectoris: Secondary | ICD-10-CM | POA: Diagnosis not present

## 2022-03-14 NOTE — Patient Instructions (Signed)
Medication Instructions:  Your physician recommends that you continue on your current medications as directed. Please refer to the Current Medication list given to you today.  *If you need a refill on your cardiac medications before your next appointment, please call your pharmacy*   Lab Work: None ordered  If you have labs (blood work) drawn today and your tests are completely normal, you will receive your results only by: [place] (if you have MyChart) OR A paper copy in the mail If you have any lab test that is abnormal or we need to change your treatment, we will call you to review the results.   Testing/Procedures: Your physician has requested that you have a lower or upper extremity arterial duplex. This test is an ultrasound of the arteries in the legs or arms. It looks at arterial blood flow in the legs and arms. Allow one hour for Lower and Upper Arterial scans. There are no restrictions or special instructions   Your physician has requested that you have a lower or upper extremity venous duplex STAT. This test is an ultrasound of the veins in the legs or arms. It looks at venous blood flow that carries blood from the heart to the legs or arms. Allow one hour for a Lower Venous exam. Allow thirty minutes for an Upper Venous exam. There are no restrictions or special instructions.     Follow-Up: At [facility], you and your health needs are our priority.  As part of our continuing mission to provide you with exceptional heart care, we have created designated Provider Care Teams.  These Care Teams include your primary Cardiologist (physician) and Advanced Practice Providers (APPs -  Physician Assistants and Nurse Practitioners) who all work together to provide you with the care you need, when you need it.  We recommend signing up for the patient portal called "MyChart".  Sign up information is provided on this After Visit Summary.  MyChart is used to connect with patients  for Virtual Visits (Telemedicine).  Patients are able to view lab/test results, encounter notes, upcoming appointments, etc.  Non-urgent messages can be sent to your provider as well.   To learn more about what you can do with MyChart, go to [email or web address].    Your next appointment:   AS SCHEDULED  The format for your next appointment:     Provider:       Other Instructions   Important Information About Sugar

## 2022-03-31 ENCOUNTER — Encounter (HOSPITAL_COMMUNITY): Payer: Medicare Other

## 2022-04-07 DIAGNOSIS — I2510 Atherosclerotic heart disease of native coronary artery without angina pectoris: Secondary | ICD-10-CM | POA: Diagnosis not present

## 2022-04-07 DIAGNOSIS — I700 Atherosclerosis of aorta: Secondary | ICD-10-CM | POA: Diagnosis not present

## 2022-04-07 DIAGNOSIS — E782 Mixed hyperlipidemia: Secondary | ICD-10-CM | POA: Diagnosis not present

## 2022-04-07 DIAGNOSIS — I10 Essential (primary) hypertension: Secondary | ICD-10-CM | POA: Diagnosis not present

## 2022-04-07 DIAGNOSIS — G2581 Restless legs syndrome: Secondary | ICD-10-CM | POA: Diagnosis not present

## 2022-04-07 DIAGNOSIS — E7849 Other hyperlipidemia: Secondary | ICD-10-CM | POA: Diagnosis not present

## 2022-04-07 DIAGNOSIS — R7309 Other abnormal glucose: Secondary | ICD-10-CM | POA: Diagnosis not present

## 2022-04-07 DIAGNOSIS — Z029 Encounter for administrative examinations, unspecified: Secondary | ICD-10-CM | POA: Diagnosis not present

## 2022-04-07 DIAGNOSIS — F17200 Nicotine dependence, unspecified, uncomplicated: Secondary | ICD-10-CM | POA: Diagnosis not present

## 2022-04-20 NOTE — Progress Notes (Signed)
Cardiology Office Note:    Date:  04/23/2022   ID:  DHILLON COMUNALE, DOB 1951/02/13, MRN [id number]  PCP:  Assunta Found, MD   [facility] Providers Cardiologist:  Charlton Haws, MD     Referring MD: Assunta Found, MD    History of Present Illness:    Harold Brady is a very pleasant 71 y.o. male with a hx of   Coronary artery disease  CAC score in 3/21: 1312 (93 rd percentile) ETT 3/21: abnormal Cath 3/21: mod LAD and LCx disease - Med Rx  Hypertension  Hyperlipidemia  +Cigs  Venous insufficiency  ABIs in 11/19: normal  He was seen in our office on 10/02/2021 by Tereso Newcomer, PA.  He reported recent shortness of breath, no chest pain.  Echocardiogram was ordered for evaluation and revealed normal LVEF 60 to 65%, no RWMA, mild LVH, G1 DD, mild calcification of the aortic valve with no evidence of stenosis.  Lipid panel in March 2023 revealed LDL 110, above goal of 70 and he was asked to increase rosuvastatin to 40 mg daily and continue ezetimibe 10 mg daily.    Seen by PA 03/14/22 for LLE edema after returning form fishing trip [place] negative for DVT just chronic venous insufficieny  Bad dreams and acts out at night with some physicality with wife On Requip and Citalopram improved when taking     Past Medical History:  Diagnosis Date   HTN (hypertension)    Echocardiogram 1/23: EF 60-65, no RWMA, mild LVH, Gr 1 DD, normal RVSF, AV sclerosis w/o AS   Hyperlipemia    Moderate 2v Coronary Artery Disease  10/01/2021   CAC score in 3/21: 1312 (93 rd percentile) // ETT 3/21: abnormal  // Cath 3/21: mod LAD and LCx disease - Med Rx    Pericardial effusion    Periodontal disease    Plantar fasciitis     Past Surgical History:  Procedure Laterality Date   BACK SURGERY     L4,L5   LEFT HEART CATH AND CORONARY ANGIOGRAPHY N/A 01/24/2020   Procedure: LEFT HEART CATH AND CORONARY ANGIOGRAPHY;  Surgeon: Lyn Records, MD;  Location: [facility] INVASIVE CV LAB;  Service: Cardiovascular;   Laterality: N/A;    Current Medications: Current Meds  Medication Sig   Acetaminophen (TYLENOL EXTRA STRENGTH PO) Take 500 mg by mouth as needed.   carvedilol (COREG) 3.125 MG tablet TAKE ONE TABLET BY MOUTH TWICE DAILY   citalopram (CELEXA) 20 MG tablet Take 20 mg by mouth at bedtime.   diclofenac (VOLTAREN) 75 MG EC tablet Take 75 mg by mouth 2 (two) times daily as needed for mild pain or moderate pain.   ezetimibe (ZETIA) 10 MG tablet Take 10 mg by mouth at bedtime.   Multiple Vitamin (MULTIVITAMIN WITH MINERALS) TABS tablet Take 1 tablet by mouth 2 (two) times a week.   olmesartan (BENICAR) 40 MG tablet Take 40 mg by mouth at bedtime.   rOPINIRole (REQUIP) 0.25 MG tablet Take 0.25-0.5 mg by mouth at bedtime.   Current Facility-Administered Medications for the 04/23/22 encounter (Office Visit) with Wendall Stade, MD  Medication   sodium chloride flush (NS) 0.9 % injection 3 mL     Allergies:   Penicillins, Procaine, Lipitor [atorvastatin calcium], and Niaspan [niacin er]   Social History   Socioeconomic History   Marital status: Married    Spouse name: Not on file   Number of children: Not on file   Years of education: Not  on file   Highest education level: Not on file  Occupational History   Not on file  Tobacco Use   Smoking status: Every Day    Packs/day: 1.00    Years: 40.00    Total pack years: 40.00    Types: Cigarettes    Start date: 09/09/1965   Smokeless tobacco: Never  Vaping Use   Vaping Use: Never used  Substance and Sexual Activity   Alcohol use: Yes    Comment: OCCASIONAL BEER   Drug use: Not on file   Sexual activity: Not on file  Other Topics Concern   Not on file  Social History Narrative   Not on file   Social Determinants of Health   Financial Resource Strain: Not on file  Food Insecurity: Not on file  Transportation Needs: Not on file  Physical Activity: Not on file  Stress: Not on file  Social Connections: Not on file     Family  History: The patient's family history includes Cancer in his father and mother; Coronary artery disease in his mother; Hypertension in his brother and father.  ROS:   Please see the history of present illness.    +LLE edema +burning in bilateral legs All other systems reviewed and are negative.  Labs/Other Studies Reviewed:    The following studies were reviewed today:  Echo 11/05/21  1. Left ventricular ejection fraction, by estimation, is 60 to 65%. The  left ventricle has normal function. The left ventricle has no regional  wall motion abnormalities. There is mild left ventricular hypertrophy.  Left ventricular diastolic parameters  are consistent with Grade I diastolic dysfunction (impaired relaxation).   2. Right ventricular systolic function is normal. The right ventricular  size is normal.   3. The mitral valve is normal in structure. No evidence of mitral valve  regurgitation. No evidence of mitral stenosis.   4. The aortic valve is tricuspid. There is mild calcification of the  aortic valve. There is mild thickening of the aortic valve. Aortic valve  regurgitation is not visualized. Aortic valve sclerosis is present, with  no evidence of aortic valve stenosis.   5. The inferior vena cava is normal in size with greater than 50%  respiratory variability, suggesting right atrial pressure of 3 mmHg.    LHC 01/24/20  Cardiac catheterization 01/24/20 LAD prox 65, mid 70 LCx ost 40, prox 50, mid 75 RCA mid 25 EF 55-65    ETT 01/17/20  Blood pressure demonstrated a hypertensive response to exercise. Upsloping ST segment depression ST segment depression of 1 mm was noted during stress in the V5 and V6 leads.   Positive ETT with 1 mm upsloping ST depression in recovery Severely HTN response to exercise Patient only achieved 79% PMHR with stress Couplets / PVCls with stress and recovery    CT Cardiac Scoring 01/17/20  Coronary calcium score of 1312. This was 27 rd  percentile for age and sex matched control.   Given how high score is would consider f/u perfusion imaging  Recent Labs: 04/21/2022: ALT 22  Recent Lipid Panel    Component Value Date/Time   CHOL 148 04/21/2022 0833   TRIG 172 (H) 04/21/2022 0833   HDL 33 (L) 04/21/2022 0833   CHOLHDL 4.5 04/21/2022 0833   CHOLHDL 4 08/26/2011 1116   VLDL 13.6 08/26/2011 1116   LDLCALC 85 04/21/2022 0833     Risk Assessment/Calculations:       Physical Exam:    VS:  BP 132/70  Pulse 68   Ht 6\' 2"  (1.88 m)   Wt 216 lb (98 kg)   SpO2 97%   BMI 27.73 kg/m     Wt Readings from Last 3 Encounters:  04/23/22 216 lb (98 kg)  03/14/22 216 lb 9.6 oz (98.2 kg)  10/02/21 225 lb 12.8 oz (102.4 kg)    Affect appropriate Healthy:  appears stated age HEENT: normal Neck supple with no adenopathy JVP normal no bruits no thyromegaly Lungs clear with no wheezing and good diaphragmatic motion Heart:  S1/S2 no murmur, no rub, gallop or click PMI normal Abdomen: benighn, BS positve, no tenderness, no AAA no bruit.  No HSM or HJR Distal pulses intact with no bruits Bilateral below knee varicosities with plus one edema  Neuro non-focal Skin warm and dry No muscular weakness    EKG:  EKG is not ordered today.    Diagnoses:    No diagnosis found.  Assessment and Plan:     Venous Insufficiency:  chronic discussed seeing Vein and vascular specialist for mapping and possible ablative procedure He is having pain and edema with standing    Claudication: sounds more neuropathic ABI"s ordered in May 2023 not done good pulses on exam defer  CAD without angina: Moderate to moderately severe two-vessel coronary disease in the circumflex and LAD by cardiac catheterization 12/2019, medical management recommended.  He denies chest pain, dyspnea, or other symptoms concerning for angina.  No indication for further ischemic evaluation at this time.   Hypertension: BP is well controlled today. No changes  to current medications.   Hyperlipidemia LDL goal < 70:  LDL 85  Crestor increased to 40 mg and Zetia    Tobacco abuse: Quit for 3 weeks but back at < 1 ppd  Lung fields ok on cardiac CT 01/17/20 will update lung cancer CT   Lung cancer screening CT   F/U in a year    Signed, 01/19/20, MD  04/23/2022 9:26 AM     [facility]

## 2022-04-21 ENCOUNTER — Other Ambulatory Visit: Payer: Medicare Other

## 2022-04-21 DIAGNOSIS — E782 Mixed hyperlipidemia: Secondary | ICD-10-CM

## 2022-04-21 LAB — LIPID PANEL
Chol/HDL Ratio: 4.5 ratio (ref 0.0–5.0)
Cholesterol, Total: 148 mg/dL (ref 100–199)
HDL: 33 mg/dL — ABNORMAL LOW (ref >39)
LDL Chol Calc (NIH): 85 mg/dL (ref 0–99)
Triglycerides: 172 mg/dL — ABNORMAL HIGH (ref 0–149)
VLDL Cholesterol Cal: 30 mg/dL (ref 5–40)

## 2022-04-21 LAB — HEPATIC FUNCTION PANEL
ALT: 22 IU/L (ref 0–44)
AST: 30 IU/L (ref 0–40)
Albumin: 3.6 g/dL — ABNORMAL LOW (ref 3.8–4.8)
Alkaline Phosphatase: 91 IU/L (ref 44–121)
Bilirubin Total: 0.2 mg/dL (ref 0.0–1.2)
Bilirubin, Direct: <0.1 mg/dL (ref 0.00–0.40)
Total Protein: 6.3 g/dL (ref 6.0–8.5)

## 2022-04-23 ENCOUNTER — Ambulatory Visit: Payer: Medicare Other | Admitting: Cardiovascular Disease

## 2022-04-23 ENCOUNTER — Encounter: Payer: Self-pay | Admitting: Cardiovascular Disease

## 2022-04-23 VITALS — BP 132/70 | HR 68 | Ht 74.0 in | Wt 216.0 lb

## 2022-04-23 DIAGNOSIS — E785 Hyperlipidemia, unspecified: Secondary | ICD-10-CM

## 2022-04-23 DIAGNOSIS — M7989 Other specified soft tissue disorders: Secondary | ICD-10-CM

## 2022-04-23 DIAGNOSIS — Z720 Tobacco use: Secondary | ICD-10-CM

## 2022-04-23 DIAGNOSIS — I2510 Atherosclerotic heart disease of native coronary artery without angina pectoris: Secondary | ICD-10-CM | POA: Diagnosis not present

## 2022-04-23 DIAGNOSIS — I10 Essential (primary) hypertension: Secondary | ICD-10-CM

## 2022-04-23 NOTE — Patient Instructions (Addendum)
Medication Instructions:  Your physician recommends that you continue on your current medications as directed. Please refer to the Current Medication list given to you today.  *If you need a refill on your cardiac medications before your next appointment, please call your pharmacy*  Lab Work: If you have labs (blood work) drawn today and your tests are completely normal, you will receive your results only by: MyChart Message (if you have MyChart) OR A paper copy in the mail If you have any lab test that is abnormal or we need to change your treatment, we will call you to review the results.  Testing/Procedures: CT scanning for lung cancer screening, (CAT scanning), is a noninvasive, special x-ray that produces cross-sectional images of the body using x-rays and a computer. CT scans help physicians diagnose and treat medical conditions. For some CT exams, a contrast material is used to enhance visibility in the area of the body being studied. CT scans provide greater clarity and reveal more details than regular x-ray exams.  Follow-Up: At [facility], you and your health needs are our priority.  As part of our continuing mission to provide you with exceptional heart care, we have created designated Provider Care Teams.  These Care Teams include your primary Cardiologist (physician) and Advanced Practice Providers (APPs -  Physician Assistants and Nurse Practitioners) who all work together to provide you with the care you need, when you need it.  We recommend signing up for the patient portal called "MyChart".  Sign up information is provided on this After Visit Summary.  MyChart is used to connect with patients for Virtual Visits (Telemedicine).  Patients are able to view lab/test results, encounter notes, upcoming appointments, etc.  Non-urgent messages can be sent to your provider as well.   To learn more about what you can do with MyChart, go to [email or web address].    Your next  appointment:   12 month(s)  The format for your next appointment:   In Person  Provider:   Charlton Haws, MD {   Important Information About Sugar

## 2022-05-06 ENCOUNTER — Encounter: Payer: Self-pay | Admitting: *Deleted

## 2022-05-12 ENCOUNTER — Telehealth: Payer: Self-pay | Admitting: Cardiovascular Disease

## 2022-05-12 DIAGNOSIS — E785 Hyperlipidemia, unspecified: Secondary | ICD-10-CM

## 2022-05-12 NOTE — Telephone Encounter (Signed)
Pt is returning call in regards to labs. Requesting a call back.

## 2022-05-12 NOTE — Telephone Encounter (Signed)
Returned call to patient, discussed lab results with patient.  Per Tereso Newcomer, PA-C: LDL still not at goal despite taking max dose rosuvastatin and ezetimibe.  Triglycerides remain mildly elevated.  LFTs normal. PLAN:  -Continue current medications -Refer to Pharm.D. lipid clinic for consideration of PCSK9 inhibitor  Referral for Lipid Clinic made, scheduler to call and schedule appt for patient. Patient verbalized understanding.

## 2022-05-17 ENCOUNTER — Encounter (HOSPITAL_COMMUNITY): Payer: Self-pay

## 2022-05-17 ENCOUNTER — Other Ambulatory Visit: Payer: Self-pay

## 2022-05-17 ENCOUNTER — Emergency Department (HOSPITAL_COMMUNITY)
Admission: EM | Admit: 2022-05-17 | Discharge: 2022-05-17 | Disposition: A | Discharge status: Home / Self Care | Payer: Medicare Other | Attending: Emergency Medicine | Admitting: Emergency Medicine

## 2022-05-17 DIAGNOSIS — N5089 Other specified disorders of the male genital organs: Secondary | ICD-10-CM | POA: Diagnosis present

## 2022-05-17 DIAGNOSIS — I10 Essential (primary) hypertension: Secondary | ICD-10-CM | POA: Diagnosis not present

## 2022-05-17 DIAGNOSIS — Z79899 Other long term (current) drug therapy: Secondary | ICD-10-CM | POA: Diagnosis not present

## 2022-05-17 MED ORDER — DOXYCYCLINE HYCLATE 100 MG PO CAPS: 100.0000 mg | ORAL_CAPSULE | Freq: Two times a day (BID) | ORAL | 0 refills | Status: DC

## 2022-05-17 NOTE — Discharge Instructions (Addendum)
Ordered scrotal ultrasound for concern.  Also also need follow-up with urology to review the results of the ultrasound and for ongoing management of this mass.  Started doxycycline empirically for possible infection.  If new fever new scrotal pain or changes in urinary habits please return to the emergency department.

## 2022-05-17 NOTE — ED Triage Notes (Signed)
Pt arrived from home via POV w c/o hard spot on left side perineal area. First noticed last Monday, pt states that it continues to grow and get harder

## 2022-05-17 NOTE — ED Provider Notes (Signed)
[facility] EMERGENCY DEPARTMENT Provider Note   CSN: [id number] Arrival date & time: 05/17/22  1845     History  Chief Complaint  Patient presents with   hard spot left perineal area    Harold Brady is a 71 y.o. male.  Harold Brady is a 71 year old male with past medical history of hypertension presenting today for scrotal mass.  Reports that last Wednesday night while stretching in his bed noticed a lump around his left scrotal area. Reports that the lump has not changed in size and was quick in onset. It is painless but sometimes uncomfortable while sitting in a firm chair.  Patient describes the mass as 6 inches long and 2 inches around.  Patient denies fever, nausea, vomiting, and diarrhea. Patient denies changes in urinary frequency denies burning, hematuria, or trouble with urination.          Home Medications Prior to Admission medications   Medication Sig Start Date End Date Taking? Authorizing Provider  doxycycline (VIBRAMYCIN) 100 MG capsule Take 1 capsule (100 mg total) by mouth 2 (two) times daily for 7 days. 05/17/22 05/24/22 Yes Gareth Eagle, PA-C  Acetaminophen (TYLENOL EXTRA STRENGTH PO) Take 500 mg by mouth as needed.    [provider]  carvedilol (COREG) 3.125 MG tablet TAKE ONE TABLET BY MOUTH TWICE DAILY 10/25/21   Wendall Stade, MD  citalopram (CELEXA) 20 MG tablet Take 20 mg by mouth at bedtime.    [provider]  diclofenac (VOLTAREN) 75 MG EC tablet Take 75 mg by mouth 2 (two) times daily as needed for mild pain or moderate pain. 09/04/21   [provider]  ezetimibe (ZETIA) 10 MG tablet Take 10 mg by mouth at bedtime.    [provider]  Multiple Vitamin (MULTIVITAMIN WITH MINERALS) TABS tablet Take 1 tablet by mouth 2 (two) times a week.    [provider]  nitroGLYCERIN (NITROSTAT) 0.4 MG SL tablet Place 1 tablet (0.4 mg total) under the tongue every 5 (five) minutes as needed for chest pain. Up to 3 tablets  5 minutes apart. 02/06/20 03/14/22  Wendall Stade, MD  olmesartan (BENICAR) 40 MG tablet Take 40 mg by mouth at bedtime.    [provider]  Omega-3 Fatty Acids (FISH OIL) 1000 MG CAPS Take 1,000 mg by mouth in the morning and at bedtime. Patient not taking: Reported on 04/23/2022    [provider]  rOPINIRole (REQUIP) 0.25 MG tablet Take 0.25-0.5 mg by mouth at bedtime. 04/09/22   [provider]  rosuvastatin (CRESTOR) 40 MG tablet Take 1 tablet (40 mg total) by mouth daily. 01/15/22 04/15/22  Tereso Newcomer T, PA-C      Allergies    Penicillins, Procaine, Lipitor [atorvastatin calcium], and Niaspan [niacin er]    Review of Systems   Review of Systems  Physical Exam Updated Vital Signs BP 115/87 (BP Location: Right Arm)   Pulse 63   Temp 98.4 F (36.9 C) (Oral)   Resp 16   Ht 6\' 2"  (1.88 m)   Wt 95.5 kg   SpO2 95%   BMI 27.04 kg/m  Physical Exam Vitals reviewed.  Abdominal:     General: Abdomen is flat.     Palpations: Abdomen is soft.     Hernia: There is no hernia in the left inguinal area or right inguinal area.  Genitourinary:    Penis: Normal and circumcised. No swelling or lesions.      Testes: Normal.  Right: Mass not present.        Left: Mass not present.     Epididymis:     Right: Normal.     Left: Normal.     Comments: Soft, well circumscribed, non-tender mass noted in the left periscrotal region. No swelling.  Lymphadenopathy:     Lower Body: No right inguinal adenopathy. No left inguinal adenopathy.     ED Results / Procedures / Treatments    Ordered scrotal ultrasound to be completed when available.   ED Course/ Medical Decision Making/ A&P                           Medical Decision Making Harold Brady is a 71 year old male with past medical history of hypertension presenting today for scrotal mass.  Differential diagnosis includes cyst with possible infection versus testicular malignancy.  Malignancy unlikely due to the  quick onset of the mass over a couple of days per the patient.  Nevertheless it cannot be ruled out and should be further investigated with ultrasound with close follow-up with urology.  At this time, this time he is afebrile with stable vitals and no changes in urination.  Started doxycycline for concern of possible infection. He is appropriate to follow-up in the outpatient setting.  Ordered ultrasound to be performed on Monday and referred to [facility] urology.          Final Clinical Impression(s) / ED Diagnoses Final diagnoses:  Scrotal mass    Rx / DC Orders ED Discharge Orders          Ordered    doxycycline (VIBRAMYCIN) 100 MG capsule  2 times daily        05/17/22 2042    US Scrotum        05/17/22 2042              Gareth Eagle, PA-C 05/17/22 2125    Eber Hong, MD 05/22/22 [phone]

## 2022-05-18 ENCOUNTER — Other Ambulatory Visit (HOSPITAL_COMMUNITY): Payer: Self-pay | Admitting: Emergency Medicine

## 2022-05-18 ENCOUNTER — Telehealth (HOSPITAL_COMMUNITY): Payer: Self-pay | Admitting: Emergency Medicine

## 2022-05-18 ENCOUNTER — Ambulatory Visit (HOSPITAL_COMMUNITY)
Admission: RE | Admit: 2022-05-18 | Discharge: 2022-05-18 | Disposition: A | Discharge status: Home / Self Care | Payer: Medicare Other | Source: Ambulatory Visit | Attending: Emergency Medicine | Admitting: Emergency Medicine

## 2022-05-18 DIAGNOSIS — I700 Atherosclerosis of aorta: Secondary | ICD-10-CM | POA: Insufficient documentation

## 2022-05-18 DIAGNOSIS — J439 Emphysema, unspecified: Secondary | ICD-10-CM | POA: Insufficient documentation

## 2022-05-18 DIAGNOSIS — I2510 Atherosclerotic heart disease of native coronary artery without angina pectoris: Secondary | ICD-10-CM | POA: Diagnosis not present

## 2022-05-18 DIAGNOSIS — F17210 Nicotine dependence, cigarettes, uncomplicated: Secondary | ICD-10-CM | POA: Diagnosis not present

## 2022-05-18 DIAGNOSIS — Z720 Tobacco use: Secondary | ICD-10-CM | POA: Insufficient documentation

## 2022-05-18 DIAGNOSIS — Z122 Encounter for screening for malignant neoplasm of respiratory organs: Secondary | ICD-10-CM | POA: Insufficient documentation

## 2022-05-18 DIAGNOSIS — J90 Pleural effusion, not elsewhere classified: Secondary | ICD-10-CM | POA: Diagnosis not present

## 2022-05-18 DIAGNOSIS — N433 Hydrocele, unspecified: Secondary | ICD-10-CM | POA: Diagnosis not present

## 2022-05-18 DIAGNOSIS — N5089 Other specified disorders of the male genital organs: Secondary | ICD-10-CM | POA: Insufficient documentation

## 2022-05-18 MED ORDER — SULFAMETHOXAZOLE-TRIMETHOPRIM 800-160 MG PO TABS: 1.0000 | ORAL_TABLET | Freq: Two times a day (BID) | ORAL | 0 refills | Status: AC

## 2022-05-18 NOTE — Telephone Encounter (Signed)
Patient's ultrasound image results reviewed.  Concerning for a scrotal abscess.  I talked to patient and it seems like this morning and started to draining and it is a foul drainage.  Sounds infectious.  He is in the waiting room so is hard to examine the area.  Offered that we could check him into the emergency department and consider further opening up this area.  He would like to try just antibiotics however since is already open and draining and he feels a lot better.  I do not think this is unreasonable but discussed that if it closes back up or is not fully opened it could worsen.  He would also like his antibiotics changed to a different pharmacy, [facility].  Needs to follow-up with urology.

## 2022-05-19 ENCOUNTER — Ambulatory Visit (HOSPITAL_COMMUNITY)
Admission: RE | Admit: 2022-05-19 | Discharge: 2022-05-19 | Disposition: A | Discharge status: Home / Self Care | Payer: Medicare Other | Source: Ambulatory Visit | Attending: Cardiovascular Disease | Admitting: Cardiovascular Disease

## 2022-05-19 DIAGNOSIS — F17210 Nicotine dependence, cigarettes, uncomplicated: Secondary | ICD-10-CM | POA: Diagnosis not present

## 2022-05-19 DIAGNOSIS — J90 Pleural effusion, not elsewhere classified: Secondary | ICD-10-CM | POA: Diagnosis not present

## 2022-05-19 DIAGNOSIS — N5089 Other specified disorders of the male genital organs: Secondary | ICD-10-CM | POA: Diagnosis not present

## 2022-05-19 DIAGNOSIS — Z720 Tobacco use: Secondary | ICD-10-CM

## 2022-05-19 DIAGNOSIS — I700 Atherosclerosis of aorta: Secondary | ICD-10-CM | POA: Diagnosis not present

## 2022-05-19 DIAGNOSIS — J439 Emphysema, unspecified: Secondary | ICD-10-CM | POA: Diagnosis not present

## 2022-05-19 DIAGNOSIS — I2510 Atherosclerotic heart disease of native coronary artery without angina pectoris: Secondary | ICD-10-CM | POA: Diagnosis not present

## 2022-05-19 DIAGNOSIS — Z122 Encounter for screening for malignant neoplasm of respiratory organs: Secondary | ICD-10-CM | POA: Diagnosis not present

## 2022-05-20 ENCOUNTER — Ambulatory Visit: Payer: Medicare Other | Admitting: Urology

## 2022-05-20 ENCOUNTER — Encounter: Payer: Self-pay | Admitting: Urology

## 2022-05-20 VITALS — BP 130/80 | HR 83 | Ht 74.0 in | Wt 210.0 lb

## 2022-05-20 DIAGNOSIS — N492 Inflammatory disorders of scrotum: Secondary | ICD-10-CM

## 2022-05-20 LAB — URINALYSIS, ROUTINE W REFLEX MICROSCOPIC
Bilirubin, UA: NEGATIVE
Glucose, UA: NEGATIVE
Ketones, UA: NEGATIVE
Leukocytes,UA: NEGATIVE
Nitrite, UA: NEGATIVE
Specific Gravity, UA: >1.03 — ABNORMAL HIGH (ref 1.005–1.030)
Urobilinogen, Ur: 4 mg/dL — ABNORMAL HIGH (ref 0.2–1.0)
pH, UA: 6 (ref 5.0–7.5)

## 2022-05-20 LAB — MICROSCOPIC EXAMINATION
Epithelial Cells (non renal): NONE SEEN /hpf (ref 0–10)
Renal Epithel, UA: NONE SEEN /hpf
WBC, UA: NONE SEEN /hpf (ref 0–5)

## 2022-05-20 NOTE — Progress Notes (Signed)
Assessment: 1. Scrotal wall abscess     Plan: I reviewed the scrotal ultrasound results from 05/18/2022 showing a left scrotal wall abscess. Wound culture sent. Continue doxycycline Return to office in 2 days for wound check   Chief Complaint:  Chief Complaint  Patient presents with   scrotal abscess    History of Present Illness:  Harold Brady is a 71 y.o. year old male who is seen in consultation from Assunta Found, MD for evaluation of scrotal abscess.  The patient was seen in the emergency room on 05/17/2022 with a left scrotal mass.  He was not having any fever or chills.  No drainage at that time.  Scrotal ultrasound from 05/18/2022 demonstrated normal testes bilaterally, right epididymal cyst, and a heterogeneous fluid collection on lateral aspect of left scrotum measuring 3.7 x 2.1 x 3.9 cm.  The patient noted spontaneous drainage from the area.  He was placed on doxycycline.  The area has continued to drain purulent fluid with a foul odor.  He has some mild discomfort associated with the area.   Past Medical History:  Past Medical History:  Diagnosis Date   HTN (hypertension)    Echocardiogram 1/23: EF 60-65, no RWMA, mild LVH, Gr 1 DD, normal RVSF, AV sclerosis w/o AS   Hyperlipemia    Moderate 2v Coronary Artery Disease  10/01/2021   CAC score in 3/21: 1312 (93 rd percentile) // ETT 3/21: abnormal  // Cath 3/21: mod LAD and LCx disease - Med Rx    Pericardial effusion    Periodontal disease    Plantar fasciitis     Past Surgical History:  Past Surgical History:  Procedure Laterality Date   BACK SURGERY     L4,L5   LEFT HEART CATH AND CORONARY ANGIOGRAPHY N/A 01/24/2020   Procedure: LEFT HEART CATH AND CORONARY ANGIOGRAPHY;  Surgeon: Lyn Records, MD;  Location: [facility];  Service: Cardiovascular;  Laterality: N/A;    Allergies:  Allergies  Allergen Reactions   Penicillins Hives and Other (See Comments)    Reaction as a child-unsure of what  happens Reaction as a child-unsure of what happens    Procaine Hives   Lipitor [Atorvastatin Calcium] Other (See Comments)    MUSCLE ACHES, GI UPSET   Niaspan [Niacin Er] Itching and Other (See Comments)    FLUSHING    Family History:  Family History  Problem Relation Age of Onset   Cancer Father    Hypertension Father    Coronary artery disease Mother    Cancer Mother        GALLBLADDER   Hypertension Brother     Social History:  Social History   Tobacco Use   Smoking status: Every Day    Packs/day: 1.00    Years: 40.00    Total pack years: 40.00    Types: Cigarettes    Start date: 09/09/1965   Smokeless tobacco: Never  Vaping Use   Vaping Use: Never used  Substance Use Topics   Alcohol use: Yes    Comment: OCCASIONAL BEER    Review of symptoms:  Constitutional:  Negative for unexplained weight loss, night sweats, fever, chills ENT:  Negative for nose bleeds, sinus pain, painful swallowing CV:  Negative for chest pain, shortness of breath, exercise intolerance, palpitations, loss of consciousness Resp:  Negative for cough, wheezing, shortness of breath GI:  Negative for nausea, vomiting, diarrhea, bloody stools GU:  Positives noted in HPI; otherwise negative for gross hematuria,  dysuria, urinary incontinence Neuro:  Negative for seizures, poor balance, limb weakness, slurred speech Psych:  Negative for lack of energy, depression, anxiety Endocrine:  Negative for polydipsia, polyuria, symptoms of hypoglycemia (dizziness, hunger, sweating) Hematologic:  Negative for anemia, purpura, petechia, prolonged or excessive bleeding, use of anticoagulants  Allergic:  Negative for difficulty breathing or choking as a result of exposure to anything; no shellfish allergy; no allergic response (rash/itch) to materials, foods  Physical exam: BP 130/80   Pulse 83   Ht 6\' 2"  (1.88 m)   Wt 210 lb (95.3 kg)   BMI 26.96 kg/m  GENERAL APPEARANCE:  Well appearing, well developed,  well nourished, NAD HEENT: Atraumatic, Normocephalic, oropharynx clear. NECK: Supple without lymphadenopathy or thyromegaly. LUNGS: Clear to auscultation bilaterally. HEART: Regular Rate and Rhythm without murmurs, gallops, or rubs. ABDOMEN: Soft, non-tender, No Masses. EXTREMITIES: Moves all extremities well.  Without clubbing, cyanosis, or edema. NEUROLOGIC:  Alert and oriented x 3, normal gait, CN II-XII grossly intact.  MENTAL STATUS:  Appropriate. BACK:  Non-tender to palpation.  No CVAT SKIN:  Warm, dry and intact.   GU: Penis:  circumcised Meatus: Normal Scrotum: area of fluctuance with purulent drainage on left lateral scrotum at inner thigh Testis: normal without masses bilateral   Results: U/A: 11-30 RBC, few bacteria  Procedure: Incision and drainage of scrotal wall abscess Under sterile conditions and using a local anesthetic, the left scrotal wall abscess was incised and drained.  Small amount of bloody drainage noted.  Wound culture was sent.  The wound was packed with iodoform gauze.  The patient tolerated the procedure well.

## 2022-05-20 NOTE — Addendum Note (Signed)
Addended byGustavus Messing on: 05/20/2022 04:23 PM   Modules accepted: Orders

## 2022-05-21 ENCOUNTER — Telehealth: Payer: Self-pay | Admitting: Cardiovascular Disease

## 2022-05-21 NOTE — Telephone Encounter (Signed)
Patient called to get test results. 

## 2022-05-21 NOTE — Telephone Encounter (Signed)
The patient has been notified of the result and verbalized understanding.  All questions (if any) were answered. Ethelda Chick, RN 05/21/2022 2:10 PM

## 2022-05-22 ENCOUNTER — Encounter: Payer: Self-pay | Admitting: Urology

## 2022-05-22 ENCOUNTER — Ambulatory Visit (INDEPENDENT_AMBULATORY_CARE_PROVIDER_SITE_OTHER): Payer: Medicare Other | Admitting: Urology

## 2022-05-22 VITALS — BP 111/74 | HR 85

## 2022-05-22 DIAGNOSIS — N492 Inflammatory disorders of scrotum: Secondary | ICD-10-CM

## 2022-05-22 NOTE — Progress Notes (Signed)
Assessment: 1. Scrotal wall abscess     Plan: Continue doxycycline Local wound care with dressing changes daily.  Patient and his wife instructed on dressing changes. Return to office in 1 week for wound check   Chief Complaint:  Chief Complaint  Patient presents with   Follow-up    History of Present Illness:  Harold Brady is a 71 y.o. year old male who is seen for further evaluation of scrotal abscess.  The patient was seen in the emergency room on 05/17/2022 with a left scrotal mass.  He was not having any fever or chills.  No drainage at that time.  Scrotal ultrasound from 05/18/2022 demonstrated normal testes bilaterally, right epididymal cyst, and a heterogeneous fluid collection on lateral aspect of left scrotum measuring 3.7 x 2.1 x 3.9 cm.  The patient noted spontaneous drainage from the area.  He was placed on doxycycline.  The area continued to drain purulent fluid with a foul odor.  He underwent incision and drainage of the abscess in the office on 05/20/2022.  He returns today for follow-up.  He reports that the pain has resolved completely.  He is having minimal drainage from the incision.  He continues on his antibiotics.  No fevers or chills.  Portions of the above documentation were copied from a prior visit for review purposes only.   Past Medical History:  Past Medical History:  Diagnosis Date   HTN (hypertension)    Echocardiogram 1/23: EF 60-65, no RWMA, mild LVH, Gr 1 DD, normal RVSF, AV sclerosis w/o AS   Hyperlipemia    Moderate 2v Coronary Artery Disease  10/01/2021   CAC score in 3/21: 1312 (93 rd percentile) // ETT 3/21: abnormal  // Cath 3/21: mod LAD and LCx disease - Med Rx    Pericardial effusion    Periodontal disease    Plantar fasciitis     Past Surgical History:  Past Surgical History:  Procedure Laterality Date   BACK SURGERY     L4,L5   LEFT HEART CATH AND CORONARY ANGIOGRAPHY N/A 01/24/2020   Procedure: LEFT HEART CATH AND CORONARY  ANGIOGRAPHY;  Surgeon: Lyn Records, MD;  Location: [facility];  Service: Cardiovascular;  Laterality: N/A;    Allergies:  Allergies  Allergen Reactions   Penicillins Hives and Other (See Comments)    Reaction as a child-unsure of what happens Reaction as a child-unsure of what happens    Procaine Hives   Lipitor [Atorvastatin Calcium] Other (See Comments)    MUSCLE ACHES, GI UPSET   Niaspan [Niacin Er] Itching and Other (See Comments)    FLUSHING    Family History:  Family History  Problem Relation Age of Onset   Cancer Father    Hypertension Father    Coronary artery disease Mother    Cancer Mother        GALLBLADDER   Hypertension Brother     Social History:  Social History   Tobacco Use   Smoking status: Every Day    Packs/day: 1.00    Years: 40.00    Total pack years: 40.00    Types: Cigarettes    Start date: 09/09/1965   Smokeless tobacco: Never  Vaping Use   Vaping Use: Never used  Substance Use Topics   Alcohol use: Yes    Comment: OCCASIONAL BEER    ROS: Constitutional:  Negative for fever, chills, weight loss CV: Negative for chest pain, previous MI, hypertension Respiratory:  Negative for shortness  of breath, wheezing, sleep apnea, frequent cough GI:  Negative for nausea, vomiting, bloody stool, GERD  Physical exam: BP 111/74   Pulse 85  GENERAL APPEARANCE:  Well appearing, well developed, well nourished, NAD HEENT:  Atraumatic, normocephalic, oropharynx clear NECK:  Supple without lymphadenopathy or thyromegaly ABDOMEN:  Soft, non-tender, no masses EXTREMITIES:  Moves all extremities well, without clubbing, cyanosis, or edema NEUROLOGIC:  Alert and oriented x 3, normal gait, CN II-XII grossly intact MENTAL STATUS:  appropriate BACK:  Non-tender to palpation, No CVAT SKIN:  Warm, dry, and intact GU: Wound on the left scrotal inguinal fold with minimal drainage; decreased induration around the area; no fluctuance  Results: None

## 2022-05-25 LAB — ANAEROBIC AND AEROBIC CULTURE

## 2022-05-28 ENCOUNTER — Telehealth: Payer: Self-pay

## 2022-05-28 NOTE — Telephone Encounter (Signed)
Patient's wife called and wanted to know if patient could swim in a saltwater pool due to his wound. She advised she would be available for someone to reach her at [phone]

## 2022-05-29 ENCOUNTER — Encounter: Payer: Self-pay | Admitting: Urology

## 2022-05-29 ENCOUNTER — Ambulatory Visit: Payer: Medicare Other | Admitting: Urology

## 2022-05-29 VITALS — BP 124/83 | HR 87 | Ht 74.0 in | Wt 210.0 lb

## 2022-05-29 DIAGNOSIS — N492 Inflammatory disorders of scrotum: Secondary | ICD-10-CM

## 2022-05-29 NOTE — Progress Notes (Signed)
Assessment: 1. Scrotal wall abscess     Plan: Return to office as needed.  Chief Complaint:  Chief Complaint  Patient presents with   scrotal abscess    History of Present Illness:  Harold Brady is a 71 y.o. year old male who is seen for further evaluation of scrotal abscess.  The patient was seen in the emergency room on 05/17/2022 with a left scrotal mass.  He was not having any fever or chills.  No drainage at that time.  Scrotal ultrasound from 05/18/2022 demonstrated normal testes bilaterally, right epididymal cyst, and a heterogeneous fluid collection on lateral aspect of left scrotum measuring 3.7 x 2.1 x 3.9 cm.  The patient noted spontaneous drainage from the area.  He was placed on doxycycline.  The area continued to drain purulent fluid with a foul odor.  He underwent incision and drainage of the abscess in the office on 05/20/2022. Wound culture grew heavy growth of mixed anaerobes, none predominant and light growth of  Enterococcus. He has completed the doxycycline.  He returns today for follow-up.  He is doing well.  No pain or drainage from the area.  The wound has almost completely healed.  Portions of the above documentation were copied from a prior visit for review purposes only.   Past Medical History:  Past Medical History:  Diagnosis Date   HTN (hypertension)    Echocardiogram 1/23: EF 60-65, no RWMA, mild LVH, Gr 1 DD, normal RVSF, AV sclerosis w/o AS   Hyperlipemia    Moderate 2v Coronary Artery Disease  10/01/2021   CAC score in 3/21: 1312 (93 rd percentile) // ETT 3/21: abnormal  // Cath 3/21: mod LAD and LCx disease - Med Rx    Pericardial effusion    Periodontal disease    Plantar fasciitis     Past Surgical History:  Past Surgical History:  Procedure Laterality Date   BACK SURGERY     L4,L5   LEFT HEART CATH AND CORONARY ANGIOGRAPHY N/A 01/24/2020   Procedure: LEFT HEART CATH AND CORONARY ANGIOGRAPHY;  Surgeon: Lyn Records, MD;  Location:  [facility];  Service: Cardiovascular;  Laterality: N/A;    Allergies:  Allergies  Allergen Reactions   Penicillins Hives and Other (See Comments)    Reaction as a child-unsure of what happens Reaction as a child-unsure of what happens    Procaine Hives   Lipitor [Atorvastatin Calcium] Other (See Comments)    MUSCLE ACHES, GI UPSET   Niaspan [Niacin Er] Itching and Other (See Comments)    FLUSHING    Family History:  Family History  Problem Relation Age of Onset   Cancer Father    Hypertension Father    Coronary artery disease Mother    Cancer Mother        GALLBLADDER   Hypertension Brother     Social History:  Social History   Tobacco Use   Smoking status: Every Day    Packs/day: 1.00    Years: 40.00    Total pack years: 40.00    Types: Cigarettes    Start date: 09/09/1965   Smokeless tobacco: Never  Vaping Use   Vaping Use: Never used  Substance Use Topics   Alcohol use: Yes    Comment: OCCASIONAL BEER    ROS: Constitutional:  Negative for fever, chills, weight loss CV: Negative for chest pain, previous MI, hypertension Respiratory:  Negative for shortness of breath, wheezing, sleep apnea, frequent cough GI:  Negative  for nausea, vomiting, bloody stool, GERD  Physical exam: BP 124/83   Pulse 87   Ht 6\' 2"  (1.88 m)   Wt 210 lb (95.3 kg)   BMI 26.96 kg/m  GU: Minimal persistent induration in the area surrounding the incision and drainage site.  No fluctuance.  No purulent drainage.  Nontender to palpation.  Results: None

## 2022-05-29 NOTE — Telephone Encounter (Signed)
Please see previous note and please advised

## 2022-06-09 ENCOUNTER — Ambulatory Visit: Payer: Medicare Other | Admitting: Pharmacist

## 2022-06-09 DIAGNOSIS — E782 Mixed hyperlipidemia: Secondary | ICD-10-CM | POA: Diagnosis not present

## 2022-06-09 DIAGNOSIS — I2510 Atherosclerotic heart disease of native coronary artery without angina pectoris: Secondary | ICD-10-CM

## 2022-06-09 MED ORDER — VASCEPA 1 G PO CAPS: 2.0000 g | ORAL_CAPSULE | Freq: Two times a day (BID) | ORAL | 11 refills | Status: DC

## 2022-06-09 NOTE — Patient Instructions (Addendum)
Your LDL cholesterol is 85 and your goal is < 70 -The pharmacy will fill your higher dose of rosuvastatin 40mg  daily for you to resume. Let your primary care doctor know that you're taking 40mg  instead of 10mg  of this so they can update their medication list  -Change to prescription fish oil, Vascepa - 2 capsules twice daily with food  Recheck fasting labs on Wednesday, November 1st any time after 7:30am  We discussed Repatha injections as a back up option depending on how your follow up lab work looks. This medication is given once every 2 weeks and lowers your LDL cholesterol by 60%

## 2022-06-09 NOTE — Progress Notes (Signed)
Patient ID: Harold Brady                 DOB: Apr 23, 1951                    MRN: [id number]     HPI: Harold Brady is a 72 y.o. male patient of Dr Eden Emms referred to lipid clinic by Tereso Newcomer, PA. PMH is significant for 2v CAD, HTN, HLD, SOB, and tobacco abuse. CT cardiac scoring in 12/2019 was elevated at 1,312 which was 93rd percentile for age and sex matched control. Cath 12/2019 showed 65-70% stenosis of the LAD, 40-75% stenosis of LCx, and 25% stenosis of mid RCA.  Pt presents today with his wife for follow up. Brings in a list of home medications - [facility] ships them to his home. Dose discrepancy noted with his rosuvastatin, he has an active rx on file from our office for 40mg  daily but most recently the pharmacy filled a 10mg  dose. Called his pharmacy, they stated in March he did get a 3 month supply of rosuvastatin 40mg  prescribed by our office, however in June his PCP sent in a refill for the 10mg  dose so for the past 2 months he's been taking a lower 10mg  dose. Dose change happened ~2 weeks before his lipid panel was checked last. Pt also takes OTC fish oil and would prefer to change to prescription formulation. Still smoking - 2 packs lasts about 1 week. Has quit for 1-2 years in the past, not currently interested in quitting due to family stress.  Current Medications: rosuvastatin 40mg  daily, ezetimibe 10mg  daily Intolerances: atorvastatin - muscle aches, GI upset; niacin - flushing, itching Risk Factors: CAD, tobacco use LDL goal: 70mg /dL  Family History: Cancer in his father and mother; Coronary artery disease in his mother; Hypertension in his brother and father.  Social History: Has smoked 1 PPD since 1966, occasional beer.  Labs: 04/21/22: TC 148, TG 172, HDL 33, LDL 85 (rosuvastatin 40mg  daily, ezetimibe 10mg  daily)  Past Medical History:  Diagnosis Date   HTN (hypertension)    Echocardiogram 1/23: EF 60-65, no RWMA, mild LVH, Gr 1 DD, normal RVSF, AV  sclerosis w/o AS   Hyperlipemia    Moderate 2v Coronary Artery Disease  10/01/2021   CAC score in 3/21: 1312 (93 rd percentile) // ETT 3/21: abnormal  // Cath 3/21: mod LAD and LCx disease - Med Rx    Pericardial effusion    Periodontal disease    Plantar fasciitis     Current Outpatient Medications on File Prior to Visit  Medication Sig Dispense Refill   Acetaminophen (TYLENOL EXTRA STRENGTH PO) Take 500 mg by mouth as needed.     carvedilol (COREG) 3.125 MG tablet TAKE ONE TABLET BY MOUTH TWICE DAILY 180 tablet 3   citalopram (CELEXA) 20 MG tablet Take 20 mg by mouth at bedtime.     diclofenac (VOLTAREN) 75 MG EC tablet Take 75 mg by mouth 2 (two) times daily as needed for mild pain or moderate pain.     doxycycline (VIBRA-TABS) 100 MG tablet Take 100 mg by mouth 2 (two) times daily.     ezetimibe (ZETIA) 10 MG tablet Take 10 mg by mouth at bedtime.     Multiple Vitamin (MULTIVITAMIN WITH MINERALS) TABS tablet Take 1 tablet by mouth 2 (two) times a week.     nitroGLYCERIN (NITROSTAT) 0.4 MG SL tablet Place 1 tablet (0.4 mg total) under the tongue every 5 (  five) minutes as needed for chest pain. Up to 3 tablets 5 minutes apart. 25 tablet 3   olmesartan (BENICAR) 40 MG tablet Take 40 mg by mouth at bedtime.     Omega-3 Fatty Acids (FISH OIL) 1000 MG CAPS Take 1,000 mg by mouth in the morning and at bedtime.     rOPINIRole (REQUIP) 0.25 MG tablet Take 0.25-0.5 mg by mouth at bedtime.     rosuvastatin (CRESTOR) 40 MG tablet Take 1 tablet (40 mg total) by mouth daily. 90 tablet 3   Current Facility-Administered Medications on File Prior to Visit  Medication Dose Route Frequency Provider Last Rate Last Admin   sodium chloride flush (NS) 0.9 % injection 3 mL  3 mL Intravenous Q12H Wendall Stade, MD        Allergies  Allergen Reactions   Penicillins Hives and Other (See Comments)    Reaction as a child-unsure of what happens Reaction as a child-unsure of what happens    Procaine Hives    Lipitor [Atorvastatin Calcium] Other (See Comments)    MUSCLE ACHES, GI UPSET   Niaspan [Niacin Er] Itching and Other (See Comments)    FLUSHING    Assessment/Plan:  1. Hyperlipidemia - LDL 85 above goal < 70 due to CAD, however PCP had sent in lower dose of rosuvastatin a few weeks before his most recent labs were drawn so he has been taking rosuvastatin 10mg  daily instead of 40mg  daily since then. Called pt's pharmacy and advised them to fill higher 40mg  dose. Will continue ezetimibe. Will also change OTC fish oil to Vascepa 2g BID, pt ok with $45/month copay. Will recheck lipids in 2-3 months. Discussed Repatha as an option to replace his ezetimibe if needed which pt is agreeable to pending results of follow up labs. Ideally changing pt back to high intensity rosuvastatin should bring his LDL to goal though.  Florette Thai E. Lew Prout, PharmD, BCACP, CPP [facility] [address] [place], [address] Phone: [phone]; Fax: [phone] 06/09/2022 9:22 AM

## 2022-06-17 ENCOUNTER — Ambulatory Visit (INDEPENDENT_AMBULATORY_CARE_PROVIDER_SITE_OTHER): Payer: Medicare Other | Admitting: Internal Medicine

## 2022-06-17 ENCOUNTER — Encounter: Payer: Self-pay | Admitting: Internal Medicine

## 2022-06-17 VITALS — BP 123/76 | HR 79 | Ht 74.0 in | Wt 211.6 lb

## 2022-06-17 DIAGNOSIS — Z0000 Encounter for general adult medical examination without abnormal findings: Secondary | ICD-10-CM

## 2022-06-17 DIAGNOSIS — I10 Essential (primary) hypertension: Secondary | ICD-10-CM

## 2022-06-17 DIAGNOSIS — G2581 Restless legs syndrome: Secondary | ICD-10-CM | POA: Diagnosis not present

## 2022-06-17 DIAGNOSIS — Z136 Encounter for screening for cardiovascular disorders: Secondary | ICD-10-CM | POA: Diagnosis not present

## 2022-06-17 DIAGNOSIS — I2510 Atherosclerotic heart disease of native coronary artery without angina pectoris: Secondary | ICD-10-CM | POA: Diagnosis not present

## 2022-06-17 DIAGNOSIS — Z87891 Personal history of nicotine dependence: Secondary | ICD-10-CM

## 2022-06-17 DIAGNOSIS — Z720 Tobacco use: Secondary | ICD-10-CM

## 2022-06-17 DIAGNOSIS — E785 Hyperlipidemia, unspecified: Secondary | ICD-10-CM | POA: Diagnosis not present

## 2022-06-17 DIAGNOSIS — E782 Mixed hyperlipidemia: Secondary | ICD-10-CM | POA: Diagnosis not present

## 2022-06-17 DIAGNOSIS — I3139 Other pericardial effusion (noninflammatory): Secondary | ICD-10-CM | POA: Diagnosis not present

## 2022-06-17 MED ORDER — NICOTINE 21 MG/24HR TD PT24: 21.0000 mg | MEDICATED_PATCH | Freq: Every day | TRANSDERMAL | 0 refills | Status: DC

## 2022-06-17 NOTE — Patient Instructions (Signed)
It was a pleasure to see you today.  Thank you for giving [place] the opportunity to be involved in your care.  Below is a brief recap of your visit and next steps.  We will plan to see you again in 4 weeks.  Summary We are getting basic labs today I have placed a referral for abdominal aortic aneurysm ultrasound screening. I have prescribed nicotine patches to work towards quitting smoking.   Next steps Over the next 4 weeks I expect that you will cut your daily smoking in half I will let you know about your lab results tomorrow You will receive a phone call to schedule the ultrasound

## 2022-06-17 NOTE — Progress Notes (Unsigned)
New Patient Office Visit  Subjective    Patient ID: Harold Brady, male    DOB: 1951/06/27  Age: 71 y.o. MRN: [id number]  CC:  Chief Complaint  Patient presents with   Establish Care   HPI Harold Brady presents to establish care.  He was previously followed by Dr. Hilma Favors at [facility].  [name] has no acute concerns today and reports feeling well.  His past medical history significant for CAD, HTN, HLD, venous insufficiency, RLS, and tobacco use.  Chronic medical conditions and outstanding preventative healthcare maintenance items discussed today are individually addressed in A/P below.  Outpatient Encounter Medications as of 06/17/2022  Medication Sig   Acetaminophen (TYLENOL EXTRA STRENGTH PO) Take 500 mg by mouth as needed.   carvedilol (COREG) 3.125 MG tablet TAKE ONE TABLET BY MOUTH TWICE DAILY   citalopram (CELEXA) 20 MG tablet Take 20 mg by mouth at bedtime.   diclofenac (VOLTAREN) 75 MG EC tablet Take 75 mg by mouth 2 (two) times daily as needed for mild pain or moderate pain.   ezetimibe (ZETIA) 10 MG tablet Take 10 mg by mouth at bedtime.   Multiple Vitamin (MULTIVITAMIN WITH MINERALS) TABS tablet Take 1 tablet by mouth 2 (two) times a week.   nicotine (NICODERM CQ - DOSED IN MG/24 HOURS) 21 mg/24hr patch Place 1 patch (21 mg total) onto the skin daily.   olmesartan (BENICAR) 40 MG tablet Take 40 mg by mouth at bedtime.   rOPINIRole (REQUIP) 0.25 MG tablet Take 0.25-0.5 mg by mouth at bedtime.   rosuvastatin (CRESTOR) 40 MG tablet Take 1 tablet (40 mg total) by mouth daily.   VASCEPA 1 g capsule Take 2 capsules (2 g total) by mouth 2 (two) times daily.   nitroGLYCERIN (NITROSTAT) 0.4 MG SL tablet Place 1 tablet (0.4 mg total) under the tongue every 5 (five) minutes as needed for chest pain. Up to 3 tablets 5 minutes apart.   [DISCONTINUED] doxycycline (VIBRA-TABS) 100 MG tablet Take 100 mg by mouth 2 (two) times daily.   Facility-Administered  Encounter Medications as of 06/17/2022  Medication   sodium chloride flush (NS) 0.9 % injection 3 mL    Past Medical History:  Diagnosis Date   HTN (hypertension)    Echocardiogram 1/23: EF 60-65, no RWMA, mild LVH, Gr 1 DD, normal RVSF, AV sclerosis w/o AS   Hyperlipemia    Moderate 2v Coronary Artery Disease  10/01/2021   CAC score in 3/21: 1312 (93 rd percentile) // ETT 3/21: abnormal  // Cath 3/21: mod LAD and LCx disease - Med Rx    Pericardial effusion    Periodontal disease    Plantar fasciitis     Past Surgical History:  Procedure Laterality Date   BACK SURGERY     L4,L5   LEFT HEART CATH AND CORONARY ANGIOGRAPHY N/A 01/24/2020   Procedure: LEFT HEART CATH AND CORONARY ANGIOGRAPHY;  Surgeon: Belva Crome, MD;  Location: [facility];  Service: Cardiovascular;  Laterality: N/A;    Family History  Problem Relation Age of Onset   Cancer Father    Hypertension Father    Coronary artery disease Mother    Cancer Mother        GALLBLADDER   Hypertension Brother     Social History   Socioeconomic History   Marital status: Married    Spouse name: Not on file   Number of children: Not on file   Years of education: Not  on file   Highest education level: Not on file  Occupational History   Not on file  Tobacco Use   Smoking status: Every Day    Packs/day: 1.00    Years: 40.00    Total pack years: 40.00    Types: Cigarettes    Start date: 09/09/1965   Smokeless tobacco: Never  Vaping Use   Vaping Use: Never used  Substance and Sexual Activity   Alcohol use: Yes    Comment: OCCASIONAL BEER   Drug use: Not on file   Sexual activity: Not on file  Other Topics Concern   Not on file  Social History Narrative   Not on file   Social Determinants of Health   Financial Resource Strain: Not on file  Food Insecurity: Not on file  Transportation Needs: Not on file  Physical Activity: Not on file  Stress: Not on file  Social Connections: Not on file   Intimate Partner Violence: Not on file    Review of Systems  Constitutional:  Negative for chills and fever.  HENT:  Negative for sore throat.   Respiratory:  Negative for cough and shortness of breath.   Cardiovascular:  Negative for chest pain, palpitations and leg swelling.  Gastrointestinal:  Negative for abdominal pain, blood in stool, constipation, diarrhea, nausea and vomiting.  Genitourinary:  Negative for dysuria and hematuria.  Musculoskeletal:  Negative for myalgias.  Skin:  Negative for itching and rash.  Neurological:  Negative for dizziness and headaches.  Psychiatric/Behavioral:  Negative for depression and suicidal ideas.    Objective    BP 123/76   Pulse 79   Ht '6\' 2"'  (1.88 m)   Wt 211 lb 9.6 oz (96 kg)   SpO2 93%   BMI 27.17 kg/m   Physical Exam Vitals reviewed.  Constitutional:      General: He is not in acute distress.    Appearance: Normal appearance. He is not ill-appearing.  HENT:     Head: Normocephalic and atraumatic.     Nose: Nose normal. No congestion or rhinorrhea.     Mouth/Throat:     Mouth: Mucous membranes are moist.     Pharynx: Oropharynx is clear.  Eyes:     Extraocular Movements: Extraocular movements intact.     Conjunctiva/sclera: Conjunctivae normal.     Pupils: Pupils are equal, round, and reactive to light.  Cardiovascular:     Rate and Rhythm: Normal rate and regular rhythm.     Pulses: Normal pulses.     Heart sounds: Normal heart sounds. No murmur heard. Pulmonary:     Effort: Pulmonary effort is normal.     Breath sounds: No wheezing or rhonchi.     Comments: Bibasilar crackles on exam Abdominal:     General: Abdomen is flat. Bowel sounds are normal. There is no distension.     Palpations: Abdomen is soft.     Tenderness: There is no abdominal tenderness.  Musculoskeletal:        General: No swelling or deformity. Normal range of motion.     Cervical back: Normal range of motion.     Left lower leg: Edema present.      Comments: Minimal edema present around the left ankle.  Skin:    General: Skin is warm and dry.     Capillary Refill: Capillary refill takes less than 2 seconds.  Neurological:     General: No focal deficit present.     Mental Status: He is alert and  oriented to person, place, and time.     Motor: No weakness.  Psychiatric:        Mood and Affect: Mood normal.        Behavior: Behavior normal.        Thought Content: Thought content normal.    Last CBC Lab Results  Component Value Date   WBC 8.0 06/17/2022   HGB 14.3 06/17/2022   HCT 44.2 06/17/2022   MCV 87 06/17/2022   MCH 28.1 06/17/2022   RDW 12.9 06/17/2022   PLT 233 08/65/7846   Last metabolic panel Lab Results  Component Value Date   GLUCOSE 65 (L) 06/17/2022   NA 141 06/17/2022   K 4.7 06/17/2022   CL 105 06/17/2022   CO2 23 06/17/2022   BUN 13 06/17/2022   CREATININE 0.79 06/17/2022   EGFR 97 06/17/2022   CALCIUM 8.7 06/17/2022   PROT 6.5 06/17/2022   ALBUMIN 3.6 (L) 06/17/2022   LABGLOB 2.9 06/17/2022   AGRATIO 1.2 06/17/2022   BILITOT 0.3 06/17/2022   ALKPHOS 96 06/17/2022   AST 22 06/17/2022   ALT 14 06/17/2022   Last lipids Lab Results  Component Value Date   CHOL 148 04/21/2022   HDL 33 (L) 04/21/2022   LDLCALC 85 04/21/2022   TRIG 172 (H) 04/21/2022   CHOLHDL 4.5 04/21/2022   Last hemoglobin A1c Lab Results  Component Value Date   HGBA1C 6.1 (H) 06/17/2022   Last thyroid functions Lab Results  Component Value Date   TSH 1.020 06/17/2022   Last vitamin B12 and Folate Lab Results  Component Value Date   VITAMINB12 1,533 (H) 06/17/2022   FOLATE 11.3 06/17/2022    Assessment & Plan:   Problem List Items Addressed This Visit       Cardiovascular and Mediastinum   HTN (hypertension)    BP 125/76 today.  Well-controlled on carvedilol and olmesartan. -No changes today      Relevant Orders   CBC with Differential (Completed)   HgB A1c (Completed)   TSH + free T4  (Completed)   B12 and Folate Panel (Completed)   HCV Ab w Reflex to Quant PCR (Completed)   Moderate 2v Coronary Artery Disease     Moderate two-vessel CAD noted on [facility] March 2021.  He denies experiencing any recent chest pain or dyspnea on exertion.  He is currently prescribed carvedilol, olmesartan, rosuvastatin, ezetimibe, and Vascepa.  He is closely followed by cardiology and has follow-up scheduled for November. -No changes today      Relevant Orders   CBC with Differential (Completed)   HgB A1c (Completed)   TSH + free T4 (Completed)   B12 and Folate Panel (Completed)   HCV Ab w Reflex to Quant PCR (Completed)     Other   Hyperlipemia    The 10-year ASCVD risk score (Arnett DK, et al., 2019) is: 22.3%   Values used to calculate the score:     Age: 19 years     Sex: Male     Is Non-Hispanic African American: No     Diabetic: No     Tobacco smoker: Yes     Systolic Blood Pressure: 962 mmHg     Is BP treated: Yes     HDL Cholesterol: 33 mg/dL     Total Cholesterol: 148 mg/dL   He has establish care at the lipid clinic and is currently taking Vascepa, Crestor, and ezetimibe.      Relevant Orders   CBC with  Differential (Completed)   HgB A1c (Completed)   TSH + free T4 (Completed)   B12 and Folate Panel (Completed)   HCV Ab w Reflex to Quant PCR (Completed)   Tobacco abuse - Primary    Current everyday smoker, smoking 6-8 cigarettes daily and has been doing so since he was a teenager.  He expresses interest in cessation today, stating that he knows he needs to.  I congratulated him on taking initial steps toward cessation.  We discussed nicotine replacement therapy options.  He would like to try using nicotine patches.  I counseled him on the intention of NRT being to aid him in gradually reducing the amount of cigarettes he is smoking each day with a goal of complete cessation, as opposed to supplementing the nicotine he is current receiving from cigarettes.  He expressed  understanding.  We will plan for follow-up in 1 month to review his progress.      Relevant Medications   nicotine (NICODERM CQ - DOSED IN MG/24 HOURS) 21 mg/24hr patch   Other Relevant Orders   CBC with Differential (Completed)   HgB A1c (Completed)   TSH + free T4 (Completed)   B12 and Folate Panel (Completed)   HCV Ab w Reflex to Quant PCR (Completed)   Restless leg syndrome    Currently taking ropinirole and Celexa on a nightly basis for RLS.  He states when he takes his medications his symptoms are well controlled. -No medication changes today -Checking B12 and folate levels      Preventative health care    -Baseline labs obtained today -LDCT for lung cancer screening completed earlier this year -He declined all outstanding vaccinations -We will obtain records from his prior PCP and review his last colonoscopy -He was agreeable with one-time ultrasound screening for AAA given his history of smoking age between 27-75.      Return in about 4 weeks (around 07/15/2022).   Johnette Abraham, MD

## 2022-06-18 DIAGNOSIS — Z0000 Encounter for general adult medical examination without abnormal findings: Secondary | ICD-10-CM | POA: Insufficient documentation

## 2022-06-18 DIAGNOSIS — G2581 Restless legs syndrome: Secondary | ICD-10-CM | POA: Insufficient documentation

## 2022-06-18 LAB — CMP14+EGFR
ALT: 14 IU/L (ref 0–44)
AST: 22 IU/L (ref 0–40)
Albumin/Globulin Ratio: 1.2 (ref 1.2–2.2)
Albumin: 3.6 g/dL — ABNORMAL LOW (ref 3.9–4.9)
Alkaline Phosphatase: 96 IU/L (ref 44–121)
BUN/Creatinine Ratio: 16 (ref 10–24)
BUN: 13 mg/dL (ref 8–27)
Bilirubin Total: 0.3 mg/dL (ref 0.0–1.2)
CO2: 23 mmol/L (ref 20–29)
Calcium: 8.7 mg/dL (ref 8.6–10.2)
Chloride: 105 mmol/L (ref 96–106)
Creatinine, Ser: 0.79 mg/dL (ref 0.76–1.27)
Globulin, Total: 2.9 g/dL (ref 1.5–4.5)
Glucose: 65 mg/dL — ABNORMAL LOW (ref 70–99)
Potassium: 4.7 mmol/L (ref 3.5–5.2)
Sodium: 141 mmol/L (ref 134–144)
Total Protein: 6.5 g/dL (ref 6.0–8.5)
eGFR: 97 mL/min/{1.73_m2} (ref >59)

## 2022-06-18 LAB — CBC WITH DIFFERENTIAL/PLATELET
Basophils Absolute: 0 10*3/uL (ref 0.0–0.2)
Basos: 0 %
EOS (ABSOLUTE): 0.1 10*3/uL (ref 0.0–0.4)
Eos: 2 %
Hematocrit: 44.2 % (ref 37.5–51.0)
Hemoglobin: 14.3 g/dL (ref 13.0–17.7)
Immature Grans (Abs): 0 10*3/uL (ref 0.0–0.1)
Immature Granulocytes: 0 %
Lymphocytes Absolute: 1.5 10*3/uL (ref 0.7–3.1)
Lymphs: 19 %
MCH: 28.1 pg (ref 26.6–33.0)
MCHC: 32.4 g/dL (ref 31.5–35.7)
MCV: 87 fL (ref 79–97)
Monocytes Absolute: 1 10*3/uL — ABNORMAL HIGH (ref 0.1–0.9)
Monocytes: 13 %
Neutrophils Absolute: 5.3 10*3/uL (ref 1.4–7.0)
Neutrophils: 66 %
Platelets: 233 10*3/uL (ref 150–450)
RBC: 5.08 x10E6/uL (ref 4.14–5.80)
RDW: 12.9 % (ref 11.6–15.4)
WBC: 8 10*3/uL (ref 3.4–10.8)

## 2022-06-18 LAB — HEMOGLOBIN A1C
Est. average glucose Bld gHb Est-mCnc: 128 mg/dL
Hgb A1c MFr Bld: 6.1 % — ABNORMAL HIGH (ref 4.8–5.6)

## 2022-06-18 LAB — TSH+FREE T4
Free T4: 1.04 ng/dL (ref 0.82–1.77)
TSH: 1.02 u[IU]/mL (ref 0.450–4.500)

## 2022-06-18 LAB — HCV INTERPRETATION

## 2022-06-18 LAB — B12 AND FOLATE PANEL
Folate: 11.3 ng/mL (ref >3.0)
Vitamin B-12: 1533 pg/mL — ABNORMAL HIGH (ref 232–1245)

## 2022-06-18 LAB — HCV AB W REFLEX TO QUANT PCR: HCV Ab: NONREACTIVE

## 2022-06-18 NOTE — Assessment & Plan Note (Signed)
Current everyday smoker, smoking 6-8 cigarettes daily and has been doing so since he was a teenager.  He expresses interest in cessation today, stating that he knows he needs to.  I congratulated him on taking initial steps toward cessation.  We discussed nicotine replacement therapy options.  He would like to try using nicotine patches.  I counseled him on the intention of NRT being to aid him in gradually reducing the amount of cigarettes he is smoking each day with a goal of complete cessation, as opposed to supplementing the nicotine he is current receiving from cigarettes.  He expressed understanding.  We will plan for follow-up in 1 month to review his progress.

## 2022-06-18 NOTE — Assessment & Plan Note (Signed)
Moderate two-vessel CAD noted on [facility] March 2021.  He denies experiencing any recent chest pain or dyspnea on exertion.  He is currently prescribed carvedilol, olmesartan, rosuvastatin, ezetimibe, and Vascepa.  He is closely followed by cardiology and has follow-up scheduled for November. -No changes today

## 2022-06-18 NOTE — Assessment & Plan Note (Signed)
The 10-year ASCVD risk score (Arnett DK, et al., 2019) is: 22.3%   Values used to calculate the score:     Age: 71 years     Sex: Male     Is Non-Hispanic African American: No     Diabetic: No     Tobacco smoker: Yes     Systolic Blood Pressure: 123 mmHg     Is BP treated: Yes     HDL Cholesterol: 33 mg/dL     Total Cholesterol: 148 mg/dL   He has establish care at the lipid clinic and is currently taking Vascepa, Crestor, and ezetimibe.

## 2022-06-18 NOTE — Assessment & Plan Note (Signed)
Currently taking ropinirole and Celexa on a nightly basis for RLS.  He states when he takes his medications his symptoms are well controlled. -No medication changes today -Checking B12 and folate levels

## 2022-06-18 NOTE — Assessment & Plan Note (Signed)
-  Baseline labs obtained today -LDCT for lung cancer screening completed earlier this year -He declined all outstanding vaccinations -We will obtain records from his prior PCP and review his last colonoscopy -He was agreeable with one-time ultrasound screening for AAA given his history of smoking age between 29-75.

## 2022-06-18 NOTE — Assessment & Plan Note (Signed)
BP 125/76 today.  Well-controlled on carvedilol and olmesartan. -No changes today

## 2022-07-09 ENCOUNTER — Ambulatory Visit (HOSPITAL_COMMUNITY): Admission: RE | Admit: 2022-07-09 | Payer: Medicare Other | Source: Ambulatory Visit

## 2022-07-15 ENCOUNTER — Ambulatory Visit: Payer: Medicare Other | Admitting: Internal Medicine

## 2022-07-15 ENCOUNTER — Ambulatory Visit: Payer: Medicare Other

## 2022-07-15 NOTE — Progress Notes (Deleted)
   Established Patient Office Visit  Subjective   Patient ID: Harold Brady, male    DOB: 12-17-1950  Age: 71 y.o. MRN: [id number]  No chief complaint on file.   HPI  {History (Optional):23778}  ROS    Objective:     There were no vitals taken for this visit. {Vitals History (Optional):23777}  Physical Exam   No results found for any visits on 07/15/22.  {Labs (Optional):23779}  The 10-year ASCVD risk score (Arnett DK, et al., 2019) is: 22.3%    Assessment & Plan:   Problem List Items Addressed This Visit   None   No follow-ups on file.    Johnette Abraham, MD

## 2022-07-16 ENCOUNTER — Other Ambulatory Visit: Payer: Self-pay

## 2022-07-16 DIAGNOSIS — Z1211 Encounter for screening for malignant neoplasm of colon: Secondary | ICD-10-CM

## 2022-07-28 DIAGNOSIS — Z1211 Encounter for screening for malignant neoplasm of colon: Secondary | ICD-10-CM | POA: Diagnosis not present

## 2022-08-04 LAB — COLOGUARD: COLOGUARD: NEGATIVE

## 2022-08-27 ENCOUNTER — Other Ambulatory Visit (HOSPITAL_COMMUNITY)
Admission: RE | Admit: 2022-08-27 | Discharge: 2022-08-27 | Disposition: A | Discharge status: Home / Self Care | Payer: Medicare Other | Source: Ambulatory Visit | Attending: Cardiovascular Disease | Admitting: Cardiovascular Disease

## 2022-08-27 ENCOUNTER — Telehealth: Payer: Self-pay

## 2022-08-27 ENCOUNTER — Other Ambulatory Visit: Payer: Medicare Other

## 2022-08-27 DIAGNOSIS — E782 Mixed hyperlipidemia: Secondary | ICD-10-CM

## 2022-08-27 DIAGNOSIS — I2510 Atherosclerotic heart disease of native coronary artery without angina pectoris: Secondary | ICD-10-CM | POA: Insufficient documentation

## 2022-08-27 DIAGNOSIS — E785 Hyperlipidemia, unspecified: Secondary | ICD-10-CM | POA: Diagnosis not present

## 2022-08-27 LAB — HEPATIC FUNCTION PANEL
ALT: 19 U/L (ref 0–44)
AST: 26 U/L (ref 15–41)
Albumin: 3.3 g/dL — ABNORMAL LOW (ref 3.5–5.0)
Alkaline Phosphatase: 73 U/L (ref 38–126)
Bilirubin, Direct: 0.1 mg/dL (ref 0.0–0.2)
Indirect Bilirubin: 0.7 mg/dL (ref 0.3–0.9)
Total Bilirubin: 0.8 mg/dL (ref 0.3–1.2)
Total Protein: 7 g/dL (ref 6.5–8.1)

## 2022-08-27 LAB — LIPID PANEL
Cholesterol: 122 mg/dL (ref 0–200)
HDL: 34 mg/dL — ABNORMAL LOW (ref >40)
LDL Cholesterol: 75 mg/dL (ref 0–99)
Total CHOL/HDL Ratio: 3.6 RATIO
Triglycerides: 65 mg/dL (ref <150)
VLDL: 13 mg/dL (ref 0–40)

## 2022-08-27 NOTE — Telephone Encounter (Signed)
Lab order sent per the Lipid clinic order from 05/27/22.

## 2022-10-14 ENCOUNTER — Encounter: Payer: Self-pay | Admitting: Family Medicine

## 2022-10-14 ENCOUNTER — Ambulatory Visit (INDEPENDENT_AMBULATORY_CARE_PROVIDER_SITE_OTHER): Payer: Medicare Other | Admitting: Family Medicine

## 2022-10-14 DIAGNOSIS — U071 COVID-19: Secondary | ICD-10-CM | POA: Diagnosis not present

## 2022-10-14 DIAGNOSIS — R059 Cough, unspecified: Secondary | ICD-10-CM

## 2022-10-14 MED ORDER — GUAIFENESIN 100 MG/5ML PO LIQD: 5.0000 mL | ORAL | 0 refills | Status: DC | PRN

## 2022-10-14 MED ORDER — NIRMATRELVIR/RITONAVIR (PAXLOVID)TABLET: 3.0000 | ORAL_TABLET | Freq: Two times a day (BID) | ORAL | 0 refills | Status: AC

## 2022-10-14 MED ORDER — NIRMATRELVIR/RITONAVIR (PAXLOVID)TABLET: 3.0000 | ORAL_TABLET | Freq: Two times a day (BID) | ORAL | 0 refills | Status: DC

## 2022-10-14 NOTE — Progress Notes (Unsigned)
Virtual Visit via Telephone Note   This visit type was conducted via telephone. This format is felt to be most appropriate for this patient at this time.  The patient did not have access to video technology/had technical difficulties with video requiring transitioning to audio format only (telephone).  All issues noted in this document were discussed and addressed.  No physical exam could be performed with this format.  Evaluation Performed:  Follow-up visit  Date:  10/15/2022   ID:  Harold, Brady [birth year], MRN [id number]  Patient Location: Home Provider Location: Clinic  Participants: Patient Location of Patient: Home Location of Provider: Clinic Consent was obtain for visit to be over via telehealth. I verified that I am speaking with the correct person using two identifiers.  PCP:  Billie Lade, MD   Chief Complaint: Positive COVID  History of Present Illness:    Harold Brady is a 71 y.o. male with patient reports that he tested positive for COVID on 10/14/2022.  His only complaints are that of a cough and feeling tired.  He denies fever, trouble breathing, muscle aches, headaches, sore throat, rhinorrhea, and problems with his symptoms of smell and taste.   The patient does have symptoms concerning for COVID-19 infection (fever, chills, cough, or new shortness of breath).   Past Medical, Surgical, Social History, Allergies, and Medications have been Reviewed.  Past Medical History:  Diagnosis Date   HTN (hypertension)    Echocardiogram 1/23: EF 60-65, no RWMA, mild LVH, Gr 1 DD, normal RVSF, AV sclerosis w/o AS   Hyperlipemia    Moderate 2v Coronary Artery Disease  10/01/2021   CAC score in 3/21: 1312 (93 rd percentile) // ETT 3/21: abnormal  // Cath 3/21: mod LAD and LCx disease - Med Rx    Pericardial effusion    Periodontal disease    Plantar fasciitis    Past Surgical History:  Procedure Laterality Date   BACK SURGERY     L4,L5   LEFT HEART  CATH AND CORONARY ANGIOGRAPHY N/A 01/24/2020   Procedure: LEFT HEART CATH AND CORONARY ANGIOGRAPHY;  Surgeon: Lyn Records, MD;  Location: [facility];  Service: Cardiovascular;  Laterality: N/A;     Current Meds  Medication Sig   Acetaminophen (TYLENOL EXTRA STRENGTH PO) Take 500 mg by mouth as needed.   carvedilol (COREG) 3.125 MG tablet TAKE ONE TABLET BY MOUTH TWICE DAILY   citalopram (CELEXA) 20 MG tablet Take 20 mg by mouth at bedtime.   diclofenac (VOLTAREN) 75 MG EC tablet Take 75 mg by mouth 2 (two) times daily as needed for mild pain or moderate pain.   ezetimibe (ZETIA) 10 MG tablet Take 10 mg by mouth at bedtime.   guaiFENesin (ROBITUSSIN) 100 MG/5ML liquid Take 5 mLs by mouth every 4 (four) hours as needed for cough or to loosen phlegm.   Multiple Vitamin (MULTIVITAMIN WITH MINERALS) TABS tablet Take 1 tablet by mouth 2 (two) times a week.   nicotine (NICODERM CQ - DOSED IN MG/24 HOURS) 21 mg/24hr patch Place 1 patch (21 mg total) onto the skin daily.   olmesartan (BENICAR) 40 MG tablet Take 40 mg by mouth at bedtime.   rOPINIRole (REQUIP) 0.25 MG tablet Take 0.25-0.5 mg by mouth at bedtime.   VASCEPA 1 g capsule Take 2 capsules (2 g total) by mouth 2 (two) times daily.   [DISCONTINUED] nirmatrelvir/ritonavir EUA (PAXLOVID) 20 x 150 MG & 10 x 100MG  TABS Take  3 tablets by mouth 2 (two) times daily for 5 days. (Take nirmatrelvir 150 mg two tablets twice daily for 5 days and ritonavir 100 mg one tablet twice daily for 5 days) Patient GFR is 90   Current Facility-Administered Medications for the 10/14/22 encounter (Office Visit) with Gilmore Laroche, FNP  Medication   sodium chloride flush (NS) 0.9 % injection 3 mL     Allergies:   Penicillins, Procaine, Lipitor [atorvastatin calcium], and Niaspan [niacin er]   ROS:   Please see the history of present illness.     All other systems reviewed and are negative.   Labs/Other Tests and Data Reviewed:    Recent  Labs: 06/17/2022: BUN 13; Creatinine, Ser 0.79; Hemoglobin 14.3; Platelets 233; Potassium 4.7; Sodium 141; TSH 1.020 08/27/2022: ALT 19   Recent Lipid Panel Lab Results  Component Value Date/Time   CHOL 122 08/27/2022 09:47 AM   CHOL 148 04/21/2022 08:33 AM   TRIG 65 08/27/2022 09:47 AM   HDL 34 (L) 08/27/2022 09:47 AM   HDL 33 (L) 04/21/2022 08:33 AM   CHOLHDL 3.6 08/27/2022 09:47 AM   LDLCALC 75 08/27/2022 09:47 AM   LDLCALC 85 04/21/2022 08:33 AM    Wt Readings from Last 3 Encounters:  06/17/22 211 lb 9.6 oz (96 kg)  05/29/22 210 lb (95.3 kg)  05/20/22 210 lb (95.3 kg)     Objective:    Vital Signs:  There were no vitals taken for this visit.     ASSESSMENT & PLAN:   Positive COVID Will treat with Paxlovid Encouraged to stay well-hydrated and to isolate for 5 days from the onset of symptoms Robitussin order for cough  Time:   Today, I have spent 12 minutes reviewing the chart, including problem list, medications, and with the patient with telehealth technology discussing the above problems.   Medication Adjustments/Labs and Tests Ordered: Current medicines are reviewed at length with the patient today.  Concerns regarding medicines are outlined above.   Tests Ordered: No orders of the defined types were placed in this encounter.   Medication Changes: Meds ordered this encounter  Medications   DISCONTD: nirmatrelvir/ritonavir EUA (PAXLOVID) 20 x 150 MG & 10 x 100MG  TABS    Sig: Take 3 tablets by mouth 2 (two) times daily for 5 days. (Take nirmatrelvir 150 mg two tablets twice daily for 5 days and ritonavir 100 mg one tablet twice daily for 5 days) Patient GFR is 90    Dispense:  30 tablet    Refill:  0   nirmatrelvir/ritonavir EUA (PAXLOVID) 20 x 150 MG & 10 x 100MG  TABS    Sig: Take 3 tablets by mouth 2 (two) times daily for 5 days. (Take nirmatrelvir 150 mg two tablets twice daily for 5 days and ritonavir 100 mg one tablet twice daily for 5 days) Patient GFR is  90    Dispense:  30 tablet    Refill:  0   guaiFENesin (ROBITUSSIN) 100 MG/5ML liquid    Sig: Take 5 mLs by mouth every 4 (four) hours as needed for cough or to loosen phlegm.    Dispense:  120 mL    Refill:  0     Note: This dictation was prepared with Dragon dictation along with smaller phrase technology. Similar sounding words can be transcribed inadequately or may not be corrected upon review. Any transcriptional errors that result from this process are unintentional.      Disposition:  Follow up  Signed, ,  FNP  10/15/2022 7:46 AM     [facility]

## 2022-10-31 ENCOUNTER — Telehealth: Payer: Self-pay | Admitting: Cardiovascular Disease

## 2022-10-31 NOTE — Telephone Encounter (Signed)
Patient states he was returning call. Please advise ?

## 2022-10-31 NOTE — Telephone Encounter (Signed)
Called patient to inform that I don't see anything in the chart as to why someone was trying to contact him. He's not due for an appt, no referral, no labs or procedures. Advised we will call back if we come across anything.

## 2022-12-09 DIAGNOSIS — M25561 Pain in right knee: Secondary | ICD-10-CM | POA: Insufficient documentation

## 2022-12-12 DIAGNOSIS — M19012 Primary osteoarthritis, left shoulder: Secondary | ICD-10-CM | POA: Diagnosis not present

## 2022-12-12 DIAGNOSIS — M25512 Pain in left shoulder: Secondary | ICD-10-CM | POA: Insufficient documentation

## 2022-12-12 DIAGNOSIS — M1711 Unilateral primary osteoarthritis, right knee: Secondary | ICD-10-CM | POA: Diagnosis not present

## 2022-12-16 DIAGNOSIS — M19012 Primary osteoarthritis, left shoulder: Secondary | ICD-10-CM | POA: Diagnosis not present

## 2022-12-25 NOTE — Progress Notes (Signed)
This encounter was created in error - please disregard.

## 2023-01-07 ENCOUNTER — Encounter: Payer: Medicare Other | Admitting: Internal Medicine

## 2023-01-08 ENCOUNTER — Encounter: Payer: Self-pay | Admitting: Internal Medicine

## 2023-02-05 ENCOUNTER — Other Ambulatory Visit: Payer: Self-pay | Admitting: Cardiovascular Disease

## 2023-02-16 ENCOUNTER — Ambulatory Visit (INDEPENDENT_AMBULATORY_CARE_PROVIDER_SITE_OTHER): Payer: Medicare Other | Admitting: Internal Medicine

## 2023-02-16 ENCOUNTER — Encounter: Payer: Self-pay | Admitting: Internal Medicine

## 2023-02-16 VITALS — BP 117/78 | HR 80 | Ht 74.0 in | Wt 208.6 lb

## 2023-02-16 DIAGNOSIS — Z139 Encounter for screening, unspecified: Secondary | ICD-10-CM | POA: Diagnosis not present

## 2023-02-16 DIAGNOSIS — J209 Acute bronchitis, unspecified: Secondary | ICD-10-CM | POA: Diagnosis not present

## 2023-02-16 DIAGNOSIS — Z1329 Encounter for screening for other suspected endocrine disorder: Secondary | ICD-10-CM

## 2023-02-16 DIAGNOSIS — Z1321 Encounter for screening for nutritional disorder: Secondary | ICD-10-CM | POA: Diagnosis not present

## 2023-02-16 DIAGNOSIS — I10 Essential (primary) hypertension: Secondary | ICD-10-CM

## 2023-02-16 DIAGNOSIS — R7303 Prediabetes: Secondary | ICD-10-CM

## 2023-02-16 DIAGNOSIS — E782 Mixed hyperlipidemia: Secondary | ICD-10-CM | POA: Diagnosis not present

## 2023-02-16 MED ORDER — DOXYCYCLINE HYCLATE 100 MG PO TABS: 100.0000 mg | ORAL_TABLET | Freq: Two times a day (BID) | ORAL | 0 refills | Status: AC

## 2023-02-16 MED ORDER — ALBUTEROL SULFATE HFA 108 (90 BASE) MCG/ACT IN AERS: 2.0000 | INHALATION_SPRAY | Freq: Four times a day (QID) | RESPIRATORY_TRACT | 0 refills | Status: DC | PRN

## 2023-02-16 MED ORDER — PROMETHAZINE-DM 6.25-15 MG/5ML PO SYRP: 2.5000 mL | ORAL_SOLUTION | Freq: Four times a day (QID) | ORAL | 0 refills | Status: DC | PRN

## 2023-02-16 NOTE — Patient Instructions (Signed)
It was a pleasure to see you today.  Thank you for giving [place] the opportunity to be involved in your care.  Below is a brief recap of your visit and next steps.  We will plan to see you again in 4-6 weeks.  Summary Start doxycycline x 7 days for bronchitis Albuterol inhaler prescribed for as needed symptom relief Promethazine-DM for cough relief at night I recommend using mucinex during the day Try cetirizine and fluticasone  Follow up in 4-6 weeks

## 2023-02-16 NOTE — Assessment & Plan Note (Signed)
Presenting today for an acute visit endorsing a 1 week history of cough productive of yellow sputum.  Poor air movement is noted on exam.  He is unaware of any recent sick contacts.  Denies fever/chills.  Overall, symptoms are likely associated seasonal allergies, however given progression of his symptoms, productive cough with change in sputum quality, and exam findings today there is concern for developing acute bronchitis. -Start doxycycline 100 mg twice daily x 7 days -Albuterol inhaler prescribed as well to help with airway clearance -Recommend Mucinex for use during the day -Promethazine-DM prescribed for cough relief at night -He will return to care for routine follow-up in 4 weeks.

## 2023-02-16 NOTE — Progress Notes (Signed)
Acute Office Visit  Subjective:     Patient ID: Harold Brady, male    DOB: 1951-01-02, 72 y.o.   MRN: [id number]  Chief Complaint  Patient presents with   Cough    Started 02/09/2023.    Mr. Robson presents for an acute visit today endorsing a 1 week history of a cough productive of yellow sputum.  He endorses sinus congestion as well.  Denies fever/chills.  He has not tried taking any medication for symptom relief.  Overall his symptoms are getting worse.  He endorses significant chest congestion, but is unable to cough anything up.  He is unaware of any recent sick contacts.  Review of Systems  Constitutional:  Negative for chills and fever.  Respiratory:  Positive for cough, sputum production and shortness of breath.   All other systems reviewed and are negative.       Objective:    BP 117/78   Pulse 80   Ht  (1.88 m)   Wt 208 lb 9.6 oz (94.6 kg)   SpO2 96%   BMI 26.78 kg/m    Physical Exam Vitals reviewed.  Constitutional:      General: He is not in acute distress.    Appearance: Normal appearance. He is not ill-appearing.  HENT:     Head: Normocephalic and atraumatic.     Right Ear: Tympanic membrane and external ear normal.     Left Ear: Tympanic membrane and external ear normal.     Nose: Congestion present.     Mouth/Throat:     Mouth: Mucous membranes are moist.     Pharynx: Oropharynx is clear. No oropharyngeal exudate or posterior oropharyngeal erythema.  Eyes:     General: No scleral icterus.    Extraocular Movements: Extraocular movements intact.     Conjunctiva/sclera: Conjunctivae normal.     Pupils: Pupils are equal, round, and reactive to light.  Cardiovascular:     Rate and Rhythm: Normal rate and regular rhythm.     Pulses: Normal pulses.     Heart sounds: Normal heart sounds. No murmur heard.    No friction rub. No gallop.  Pulmonary:     Effort: Pulmonary effort is normal.     Comments: Poor air movement bilaterally Abdominal:      General: Abdomen is flat. Bowel sounds are normal. There is no distension.     Palpations: Abdomen is soft.     Tenderness: There is no abdominal tenderness.  Musculoskeletal:        General: Normal range of motion.     Cervical back: Normal range of motion.  Lymphadenopathy:     Cervical: No cervical adenopathy.  Skin:    General: Skin is warm and dry.     Capillary Refill: Capillary refill takes less than 2 seconds.  Neurological:     General: No focal deficit present.     Mental Status: He is alert and oriented to person, place, and time.  Psychiatric:        Mood and Affect: Mood normal.        Behavior: Behavior normal.       Assessment & Plan:   Problem List Items Addressed This Visit       Acute bronchitis - Primary    Presenting today for an acute visit endorsing a 1 week history of cough productive of yellow sputum.  Poor air movement is noted on exam.  He is unaware of any recent sick contacts.  Denies  fever/chills.  Overall, symptoms are likely associated seasonal allergies, however given progression of his symptoms, productive cough with change in sputum quality, and exam findings today there is concern for developing acute bronchitis. -Start doxycycline 100 mg twice daily x 7 days -Albuterol inhaler prescribed as well to help with airway clearance -Recommend Mucinex for use during the day -Promethazine-DM prescribed for cough relief at night -He will return to care for routine follow-up in 4 weeks.      Meds ordered this encounter  Medications   doxycycline (VIBRA-TABS) 100 MG tablet    Sig: Take 1 tablet (100 mg total) by mouth 2 (two) times daily for 7 days.    Dispense:  14 tablet    Refill:  0   promethazine-dextromethorphan (PROMETHAZINE-DM) 6.25-15 MG/5ML syrup    Sig: Take 2.5 mLs by mouth 4 (four) times daily as needed for cough.    Dispense:  118 mL    Refill:  0   albuterol (VENTOLIN HFA) 108 (90 Base) MCG/ACT inhaler    Sig: Inhale 2 puffs  into the lungs every 6 (six) hours as needed for wheezing or shortness of breath.    Dispense:  8 g    Refill:  0    Return in about 4 weeks (around 03/16/2023).  Billie Lade, MD

## 2023-02-17 LAB — CBC WITH DIFFERENTIAL/PLATELET
Basophils Absolute: 0 10*3/uL (ref 0.0–0.2)
Basos: 0 %
EOS (ABSOLUTE): 0.1 10*3/uL (ref 0.0–0.4)
Eos: 1 %
Hematocrit: 45.2 % (ref 37.5–51.0)
Hemoglobin: 14.9 g/dL (ref 13.0–17.7)
Immature Grans (Abs): 0 10*3/uL (ref 0.0–0.1)
Immature Granulocytes: 0 %
Lymphocytes Absolute: 1.6 10*3/uL (ref 0.7–3.1)
Lymphs: 16 %
MCH: 29.4 pg (ref 26.6–33.0)
MCHC: 33 g/dL (ref 31.5–35.7)
MCV: 89 fL (ref 79–97)
Monocytes Absolute: 1.1 10*3/uL — ABNORMAL HIGH (ref 0.1–0.9)
Monocytes: 10 %
Neutrophils Absolute: 7.6 10*3/uL — ABNORMAL HIGH (ref 1.4–7.0)
Neutrophils: 73 %
Platelets: 252 10*3/uL (ref 150–450)
RBC: 5.06 x10E6/uL (ref 4.14–5.80)
RDW: 13.6 % (ref 11.6–15.4)
WBC: 10.4 10*3/uL (ref 3.4–10.8)

## 2023-02-17 LAB — LIPID PANEL
Chol/HDL Ratio: 2.9 ratio (ref 0.0–5.0)
Cholesterol, Total: 112 mg/dL (ref 100–199)
HDL: 38 mg/dL — ABNORMAL LOW (ref >39)
LDL Chol Calc (NIH): 57 mg/dL (ref 0–99)
Triglycerides: 87 mg/dL (ref 0–149)
VLDL Cholesterol Cal: 17 mg/dL (ref 5–40)

## 2023-02-17 LAB — TSH+FREE T4
Free T4: 1.21 ng/dL (ref 0.82–1.77)
TSH: 0.578 u[IU]/mL (ref 0.450–4.500)

## 2023-02-17 LAB — CMP14+EGFR
ALT: 18 IU/L (ref 0–44)
AST: 19 IU/L (ref 0–40)
Albumin/Globulin Ratio: 1.6 (ref 1.2–2.2)
Albumin: 4.1 g/dL (ref 3.9–4.9)
Alkaline Phosphatase: 84 IU/L (ref 44–121)
BUN/Creatinine Ratio: 13 (ref 10–24)
BUN: 13 mg/dL (ref 8–27)
Bilirubin Total: 0.5 mg/dL (ref 0.0–1.2)
CO2: 23 mmol/L (ref 20–29)
Calcium: 9.1 mg/dL (ref 8.6–10.2)
Chloride: 104 mmol/L (ref 96–106)
Creatinine, Ser: 1.04 mg/dL (ref 0.76–1.27)
Globulin, Total: 2.5 g/dL (ref 1.5–4.5)
Glucose: 97 mg/dL (ref 70–99)
Potassium: 4.8 mmol/L (ref 3.5–5.2)
Sodium: 141 mmol/L (ref 134–144)
Total Protein: 6.6 g/dL (ref 6.0–8.5)
eGFR: 78 mL/min/{1.73_m2} (ref >59)

## 2023-02-17 LAB — HEMOGLOBIN A1C
Est. average glucose Bld gHb Est-mCnc: 128 mg/dL
Hgb A1c MFr Bld: 6.1 % — ABNORMAL HIGH (ref 4.8–5.6)

## 2023-02-17 LAB — B12 AND FOLATE PANEL
Folate: 17.3 ng/mL (ref >3.0)
Vitamin B-12: 327 pg/mL (ref 232–1245)

## 2023-02-17 LAB — VITAMIN D 25 HYDROXY (VIT D DEFICIENCY, FRACTURES): Vit D, 25-Hydroxy: 29 ng/mL — ABNORMAL LOW (ref 30.0–100.0)

## 2023-03-14 ENCOUNTER — Telehealth: Payer: Self-pay | Admitting: Internal Medicine

## 2023-03-14 NOTE — Telephone Encounter (Signed)
Wife called me Sat 03/14/23  and said Harold Brady is coughing really bad and needs to come in sooner than Wednesday 5/15.  I told her Dr Durwin Nora was not in the office on Monday per the schedule in Epic.  Can you please check and see if you all have a cancellation on Tuesday and move him up if possible or work him in Tuesday if possible.  Please call his wife.  She is on his list to talk with. I told her I sent Lawson Fiscal and Baxter Hire a message.  Thanks

## 2023-03-15 ENCOUNTER — Encounter: Payer: Self-pay | Admitting: Emergency Medicine

## 2023-03-15 ENCOUNTER — Ambulatory Visit
Admission: EM | Admit: 2023-03-15 | Discharge: 2023-03-15 | Disposition: A | Discharge status: Home / Self Care | Payer: Medicare Other

## 2023-03-15 ENCOUNTER — Other Ambulatory Visit: Payer: Self-pay

## 2023-03-15 DIAGNOSIS — Z800 Family history of malignant neoplasm of digestive organs: Secondary | ICD-10-CM | POA: Insufficient documentation

## 2023-03-15 DIAGNOSIS — K5900 Constipation, unspecified: Secondary | ICD-10-CM | POA: Insufficient documentation

## 2023-03-15 DIAGNOSIS — R1032 Left lower quadrant pain: Secondary | ICD-10-CM | POA: Insufficient documentation

## 2023-03-15 DIAGNOSIS — R141 Gas pain: Secondary | ICD-10-CM | POA: Insufficient documentation

## 2023-03-15 DIAGNOSIS — J439 Emphysema, unspecified: Secondary | ICD-10-CM

## 2023-03-15 DIAGNOSIS — Z86010 Personal history of colonic polyps: Secondary | ICD-10-CM | POA: Insufficient documentation

## 2023-03-15 DIAGNOSIS — Z1211 Encounter for screening for malignant neoplasm of colon: Secondary | ICD-10-CM | POA: Insufficient documentation

## 2023-03-15 HISTORY — DX: Emphysema, unspecified: J43.9

## 2023-03-15 MED ORDER — ALBUTEROL SULFATE HFA 108 (90 BASE) MCG/ACT IN AERS: 2.0000 | INHALATION_SPRAY | RESPIRATORY_TRACT | 0 refills | Status: DC | PRN

## 2023-03-15 MED ORDER — AZITHROMYCIN 250 MG PO TABS: 250.0000 mg | ORAL_TABLET | Freq: Every day | ORAL | 0 refills | Status: DC

## 2023-03-15 MED ORDER — PROMETHAZINE-DM 6.25-15 MG/5ML PO SYRP: 2.5000 mL | ORAL_SOLUTION | Freq: Four times a day (QID) | ORAL | 0 refills | Status: DC | PRN

## 2023-03-15 MED ORDER — PREDNISONE 5 MG (21) PO TBPK: ORAL_TABLET | ORAL | 0 refills | Status: DC

## 2023-03-15 NOTE — ED Triage Notes (Signed)
Pt reports was seen and treated by pcp on 4/22 for bronchitis. Pt family reports was started on antibiotic and states cough improved but has never went away. Pt states "feels like there is something in my chest that wont come up when I cough." NAD noted. Airway patent. Denies any known fevers.

## 2023-03-15 NOTE — Discharge Instructions (Signed)
Prednisone taper was prescribed/take as directed Promethazine/dexamethasone Morform was prescribed for cough Azithromycin was prescribed ProAir was prescribed Get plenty of rest and push fluids Follow-up with PCP Use medications daily for symptom relief Use OTC medications like ibuprofen or tylenol as needed fever or pain Call or go to the ED if you have any new or worsening symptoms such as fever, worsening cough, shortness of breath, chest tightness, chest pain, turning blue, changes in mental status, etc..

## 2023-03-15 NOTE — ED Provider Notes (Signed)
[facility]   [id number] 03/15/23 Arrival Time: 1509   Chief Complaint  Patient presents with   Cough     SUBJECTIVE: History from: patient and family.  Harold Brady is a 72 y.o. male with history of emphysema and current smoker presented to the urgent care with a complaint of worsening cough with yellowish-green mucus for the past few days..  Denies sick exposure to COVID, flu or strep.  Denies recent travel.  Has tried OTC medication  without relief.  Denies any aggravating factors.  Reports previous symptoms in the past and was diagnosed with bronchitis.   Denies fever, chills, fatigue, sinus pain, rhinorrhea, sore throat, SOB, wheezing, chest pain, nausea, changes in bowel or bladder habits.      ROS: As per HPI.  All other pertinent ROS negative.      Past Medical History:  Diagnosis Date   HTN (hypertension)    Echocardiogram 1/23: EF 60-65, no RWMA, mild LVH, Gr 1 DD, normal RVSF, AV sclerosis w/o AS   Hyperlipemia    Mild emphysema (HCC)    Moderate 2v Coronary Artery Disease  10/01/2021   CAC score in 3/21: 1312 (93 rd percentile) // ETT 3/21: abnormal  // Cath 3/21: mod LAD and LCx disease - Med Rx    Pericardial effusion    Periodontal disease    Plantar fasciitis    Past Surgical History:  Procedure Laterality Date   BACK SURGERY     L4,L5   LEFT HEART CATH AND CORONARY ANGIOGRAPHY N/A 01/24/2020   Procedure: LEFT HEART CATH AND CORONARY ANGIOGRAPHY;  Surgeon: Lyn Records, MD;  Location: [facility];  Service: Cardiovascular;  Laterality: N/A;   Allergies  Allergen Reactions   Penicillins Hives and Other (See Comments)    Reaction as a child-unsure of what happens   Procaine Hives   Lipitor [Atorvastatin Calcium] Other (See Comments)    MUSCLE ACHES, GI UPSET   Niaspan [Niacin Er] Itching and Other (See Comments)    FLUSHING   Current Facility-Administered Medications on File Prior to Encounter  Medication Dose Route Frequency  Provider Last Rate Last Admin   sodium chloride flush (NS) 0.9 % injection 3 mL  3 mL Intravenous Q12H Wendall Stade, MD       Current Outpatient Medications on File Prior to Encounter  Medication Sig Dispense Refill   Acetaminophen (TYLENOL EXTRA STRENGTH PO) Take 500 mg by mouth as needed.     carvedilol (COREG) 3.125 MG tablet TAKE (1) TABLET BY MOUTH TWICE DAILY. 60 tablet 3   citalopram (CELEXA) 20 MG tablet Take 20 mg by mouth at bedtime.     diclofenac (VOLTAREN) 75 MG EC tablet Take 75 mg by mouth 2 (two) times daily as needed for mild pain or moderate pain.     ezetimibe (ZETIA) 10 MG tablet Take 10 mg by mouth at bedtime.     guaiFENesin (ROBITUSSIN) 100 MG/5ML liquid Take 5 mLs by mouth every 4 (four) hours as needed for cough or to loosen phlegm. 120 mL 0   Multiple Vitamin (MULTIVITAMIN WITH MINERALS) TABS tablet Take 1 tablet by mouth 2 (two) times a week.     nicotine (NICODERM CQ - DOSED IN MG/24 HOURS) 21 mg/24hr patch Place 1 patch (21 mg total) onto the skin daily. 28 patch 0   nitroGLYCERIN (NITROSTAT) 0.4 MG SL tablet Place 1 tablet (0.4 mg total) under the tongue every 5 (five) minutes as needed for chest  pain. Up to 3 tablets 5 minutes apart. 25 tablet 3   olmesartan (BENICAR) 40 MG tablet Take 40 mg by mouth at bedtime.     rOPINIRole (REQUIP) 0.25 MG tablet Take 0.25-0.5 mg by mouth at bedtime.     rosuvastatin (CRESTOR) 10 MG tablet Take 10 mg by mouth daily.     rosuvastatin (CRESTOR) 40 MG tablet Take 1 tablet (40 mg total) by mouth daily. 90 tablet 3   VASCEPA 1 g capsule Take 2 capsules (2 g total) by mouth 2 (two) times daily. 120 capsule 11   Social History   Socioeconomic History   Marital status: Married    Spouse name: Not on file   Number of children: Not on file   Years of education: Not on file   Highest education level: Not on file  Occupational History   Not on file  Tobacco Use   Smoking status: Every Day    Packs/day: 1.00    Years: 40.00     Additional pack years: 0.00    Total pack years: 40.00    Types: Cigarettes    Start date: 09/09/1965   Smokeless tobacco: Never  Vaping Use   Vaping Use: Never used  Substance and Sexual Activity   Alcohol use: Not Currently    Comment: OCCASIONAL BEER   Drug use: Not on file   Sexual activity: Not on file  Other Topics Concern   Not on file  Social History Narrative   Not on file   Social Determinants of Health   Financial Resource Strain: Not on file  Food Insecurity: Not on file  Transportation Needs: Not on file  Physical Activity: Not on file  Stress: Not on file  Social Connections: Not on file  Intimate Partner Violence: Not on file   Family History  Problem Relation Age of Onset   Coronary artery disease Mother    Cancer Mother        GALLBLADDER   Colon cancer Father    Hypertension Father    Lead poisoning Father        Asbestos   Hypertension Brother    Diabetes Maternal Grandfather    Heart failure Maternal Grandfather        Open heart surgery   Breast cancer Paternal Grandmother    Diabetes Other     OBJECTIVE:  Vitals:   03/15/23 1521  BP: (!) 159/88  Pulse: 69  Resp: (!) 22  Temp: 97.9 F (36.6 C)  TempSrc: Oral  SpO2: 94%     Physical Exam Vitals and nursing note reviewed.  Constitutional:      General: He is not in acute distress.    Appearance: Normal appearance. He is normal weight. He is not ill-appearing, toxic-appearing or diaphoretic.  HENT:     Right Ear: Tympanic membrane, ear canal and external ear normal. There is no impacted cerumen.     Left Ear: Tympanic membrane, ear canal and external ear normal. There is no impacted cerumen.  Cardiovascular:     Rate and Rhythm: Normal rate and regular rhythm.     Pulses: Normal pulses.     Heart sounds: Normal heart sounds. No murmur heard.    No friction rub. No gallop.  Pulmonary:     Effort: Pulmonary effort is normal. No respiratory distress.     Breath sounds: No  stridor. Wheezing present. No rhonchi or rales.  Chest:     Chest wall: No tenderness.  Neurological:  Mental Status: He is alert and oriented to person, place, and time.     LABS:  No results found for this or any previous visit (from the past 24 hour(s)).   ASSESSMENT & PLAN:  No diagnosis found.  Meds ordered this encounter  Medications   promethazine-dextromethorphan (PROMETHAZINE-DM) 6.25-15 MG/5ML syrup    Sig: Take 2.5 mLs by mouth 4 (four) times daily as needed for cough.    Dispense:  118 mL    Refill:  0   azithromycin (ZITHROMAX Z-PAK) 250 MG tablet    Sig: Take 1 tablet (250 mg total) by mouth daily. Take 2 tablet (500 mg) by mouth on day 1, then take 1 tablet (250 mg) by mouth for the next 4 days    Dispense:  6 tablet    Refill:  0   predniSONE (STERAPRED UNI-PAK 21 TAB) 5 MG (21) TBPK tablet    Sig: Take 6 tablets by mouth on day 1, then 5 tablet by mouth on day 2, then 4 tablets by mouth on day 3, then 3 tablets by mouth on day 4, then 2 tablet by mouth on day 5, then 1 tablet by mouth on days 6    Dispense:  21 tablet    Refill:  0   albuterol (VENTOLIN HFA) 108 (90 Base) MCG/ACT inhaler    Sig: Inhale 2 puffs into the lungs every 4 (four) hours as needed for wheezing or shortness of breath.    Dispense:  18 g    Refill:  0   Discharge instructions  Prednisone taper was prescribed/take as directed Promethazine/dexamethasone Morform was prescribed for cough Azithromycin was prescribed ProAir was prescribed Get plenty of rest and push fluids Follow-up with PCP Use medications daily for symptom relief Use OTC medications like ibuprofen or tylenol as needed fever or pain Call or go to the ED if you have any new or worsening symptoms such as fever, worsening cough, shortness of breath, chest tightness, chest pain, turning blue, changes in mental status, etc...   Reviewed expectations re: course of current medical issues. Questions answered. Outlined  signs and symptoms indicating need for more acute intervention. Patient verbalized understanding. After Visit Summary given.          Durward Parcel, FNP 03/15/23 [phone]

## 2023-03-16 NOTE — Telephone Encounter (Signed)
Called patient wife to get in sooner, offer the patient today or tomorrow, patient spouse said no will keep Wednesday appointment since he went to Urgent Care.

## 2023-03-18 ENCOUNTER — Encounter: Payer: Self-pay | Admitting: Internal Medicine

## 2023-03-18 ENCOUNTER — Ambulatory Visit (INDEPENDENT_AMBULATORY_CARE_PROVIDER_SITE_OTHER): Payer: Medicare Other | Admitting: Internal Medicine

## 2023-03-18 VITALS — BP 111/69 | HR 75 | Ht 74.0 in | Wt 210.6 lb

## 2023-03-18 DIAGNOSIS — I10 Essential (primary) hypertension: Secondary | ICD-10-CM

## 2023-03-18 DIAGNOSIS — E782 Mixed hyperlipidemia: Secondary | ICD-10-CM

## 2023-03-18 DIAGNOSIS — Z720 Tobacco use: Secondary | ICD-10-CM | POA: Diagnosis not present

## 2023-03-18 DIAGNOSIS — F515 Nightmare disorder: Secondary | ICD-10-CM | POA: Insufficient documentation

## 2023-03-18 DIAGNOSIS — Z122 Encounter for screening for malignant neoplasm of respiratory organs: Secondary | ICD-10-CM | POA: Diagnosis not present

## 2023-03-18 DIAGNOSIS — J439 Emphysema, unspecified: Secondary | ICD-10-CM | POA: Diagnosis not present

## 2023-03-18 DIAGNOSIS — J449 Chronic obstructive pulmonary disease, unspecified: Secondary | ICD-10-CM | POA: Insufficient documentation

## 2023-03-18 DIAGNOSIS — F4312 Post-traumatic stress disorder, chronic: Secondary | ICD-10-CM | POA: Insufficient documentation

## 2023-03-18 MED ORDER — PRAZOSIN HCL 1 MG PO CAPS
1.0000 mg | ORAL_CAPSULE | Freq: Every day | ORAL | 0 refills | Status: DC
Start: 2023-03-18 — End: 2024-03-14

## 2023-03-18 MED ORDER — PROMETHAZINE-DM 6.25-15 MG/5ML PO SYRP: 2.5000 mL | ORAL_SOLUTION | Freq: Four times a day (QID) | ORAL | 0 refills | Status: DC | PRN

## 2023-03-18 NOTE — Assessment & Plan Note (Signed)
Acute concern today is vivid nightmares that occur 2-3 nights per week.  His wife reports that he will occasionally yell out and tends to act out his dreams.  She is concerned for her safety. Harold Brady endorses a traumatic psychiatric history of watching his best friend drowned and a close friend passed 2 years ago.  Suspect that there is a component of PTSD contributing to his nightmares. -Prazosin prescribed today for treatment of nightmares associated with PTSD -He will return to care if his symptoms worsen or fail to improve

## 2023-03-18 NOTE — Patient Instructions (Signed)
It was a pleasure to see you today.  Thank you for giving [place] the opportunity to be involved in your care.  Below is a brief recap of your visit and next steps.  We will plan to see you again in 6 months.  Summary Try Prazosin for treatment of nightmares Pulmonary function testing ordered Lung cancer screening referral placed Follow up in 6 months

## 2023-03-18 NOTE — Progress Notes (Signed)
Established Patient Office Visit  Subjective   Patient ID: Harold Brady, male    DOB: 11/10/1950  Age: 72 y.o. MRN: [id number]  Chief Complaint  Patient presents with   Follow-up   Harold Brady returns to care today for 4-week follow-up.  He was last evaluated by me on 4/22 for an acute visit and was treated for acute bronchitis.  4-week follow-up was arranged for routine care.  In the interim, he presented to the emergency department on 5/19 endorsing a recent history of productive cough.  Treated with a Z-Pak/steroids for presumed COPD exacerbation.  There have otherwise been no acute interval events. Harold Brady reports feeling fairly well today.  He states that his respiratory symptoms are improving.  He is not coughing as frequently.  He is accompanied by his wife today, who is concerned about his nightmares.  She states that Harold Brady experiences vivid nightmares 2-3 nights per week.  She is concerned for her safety as he will occasionally act out his nightmares and she is afraid that she will incidentally get hurt.  Her children have recommended that she sleep in a separate bedroom, however she does not plan to.  She is interested in medication options for treatment.  Past Medical History:  Diagnosis Date   HTN (hypertension)    Echocardiogram 1/23: EF 60-65, no RWMA, mild LVH, Gr 1 DD, normal RVSF, AV sclerosis w/o AS   Hyperlipemia    Mild emphysema (HCC)    Moderate 2v Coronary Artery Disease  10/01/2021   CAC score in 3/21: 1312 (93 rd percentile) // ETT 3/21: abnormal  // Cath 3/21: mod LAD and LCx disease - Med Rx    Pericardial effusion    Periodontal disease    Plantar fasciitis    Past Surgical History:  Procedure Laterality Date   BACK SURGERY     L4,L5   LEFT HEART CATH AND CORONARY ANGIOGRAPHY N/A 01/24/2020   Procedure: LEFT HEART CATH AND CORONARY ANGIOGRAPHY;  Surgeon: Lyn Records, MD;  Location: [facility];  Service: Cardiovascular;  Laterality: N/A;    Social History   Tobacco Use   Smoking status: Every Day    Packs/day: 1.00    Years: 40.00    Additional pack years: 0.00    Total pack years: 40.00    Types: Cigarettes    Start date: 09/09/1965   Smokeless tobacco: Never  Vaping Use   Vaping Use: Never used  Substance Use Topics   Alcohol use: Not Currently    Comment: OCCASIONAL BEER   Family History  Problem Relation Age of Onset   Coronary artery disease Mother    Cancer Mother        GALLBLADDER   Colon cancer Father    Hypertension Father    Lead poisoning Father        Asbestos   Hypertension Brother    Diabetes Maternal Grandfather    Heart failure Maternal Grandfather        Open heart surgery   Breast cancer Paternal Grandmother    Diabetes Other    Allergies  Allergen Reactions   Penicillins Hives and Other (See Comments)    Reaction as a child-unsure of what happens   Procaine Hives   Lipitor [Atorvastatin Calcium] Other (See Comments)    MUSCLE ACHES, GI UPSET   Niaspan [Niacin Er] Itching and Other (See Comments)    FLUSHING   Review of Systems  Psychiatric/Behavioral:  Vivid nightmares  All other systems reviewed and are negative.    Objective:     BP 111/69   Pulse 75   Ht 6\' 2"  (1.88 m)   Wt 210 lb 9.6 oz (95.5 kg)   SpO2 95%   BMI 27.04 kg/m  BP Readings from Last 3 Encounters:  03/18/23 111/69  03/15/23 (!) 159/88  02/16/23 117/78   Physical Exam Vitals reviewed.  Constitutional:      General: He is not in acute distress.    Appearance: Normal appearance. He is not ill-appearing.  HENT:     Head: Normocephalic and atraumatic.     Right Ear: External ear normal.     Left Ear: External ear normal.     Nose: Nose normal. No congestion or rhinorrhea.     Mouth/Throat:     Mouth: Mucous membranes are moist.     Pharynx: Oropharynx is clear.  Eyes:     General: No scleral icterus.    Extraocular Movements: Extraocular movements intact.     Conjunctiva/sclera:  Conjunctivae normal.     Pupils: Pupils are equal, round, and reactive to light.  Cardiovascular:     Rate and Rhythm: Normal rate and regular rhythm.     Pulses: Normal pulses.     Heart sounds: Normal heart sounds. No murmur heard. Pulmonary:     Effort: Pulmonary effort is normal.     Breath sounds: Normal breath sounds. No wheezing, rhonchi or rales.  Abdominal:     General: Abdomen is flat. Bowel sounds are normal. There is no distension.     Palpations: Abdomen is soft.     Tenderness: There is no abdominal tenderness.  Musculoskeletal:        General: No swelling or deformity. Normal range of motion.     Cervical back: Normal range of motion.  Skin:    General: Skin is warm and dry.     Capillary Refill: Capillary refill takes less than 2 seconds.  Neurological:     General: No focal deficit present.     Mental Status: He is alert and oriented to person, place, and time.     Motor: No weakness.  Psychiatric:        Mood and Affect: Mood normal.        Behavior: Behavior normal.        Thought Content: Thought content normal.   Last CBC Lab Results  Component Value Date   WBC 10.4 02/16/2023   HGB 14.9 02/16/2023   HCT 45.2 02/16/2023   MCV 89 02/16/2023   MCH 29.4 02/16/2023   RDW 13.6 02/16/2023   PLT 252 02/16/2023   Last metabolic panel Lab Results  Component Value Date   GLUCOSE 97 02/16/2023   NA 141 02/16/2023   K 4.8 02/16/2023   CL 104 02/16/2023   CO2 23 02/16/2023   BUN 13 02/16/2023   CREATININE 1.04 02/16/2023   EGFR 78 02/16/2023   CALCIUM 9.1 02/16/2023   PROT 6.6 02/16/2023   ALBUMIN 4.1 02/16/2023   LABGLOB 2.5 02/16/2023   AGRATIO 1.6 02/16/2023   BILITOT 0.5 02/16/2023   ALKPHOS 84 02/16/2023   AST 19 02/16/2023   ALT 18 02/16/2023   Last lipids Lab Results  Component Value Date   CHOL 112 02/16/2023   HDL 38 (L) 02/16/2023   LDLCALC 57 02/16/2023   TRIG 87 02/16/2023   CHOLHDL 2.9 02/16/2023   Last hemoglobin A1c Lab  Results  Component Value Date  HGBA1C 6.1 (H) 02/16/2023   Last thyroid functions Lab Results  Component Value Date   TSH 0.578 02/16/2023   Last vitamin D Lab Results  Component Value Date   VD25OH 29.0 (L) 02/16/2023   Last vitamin B12 and Folate Lab Results  Component Value Date   VITAMINB12 327 02/16/2023   FOLATE 17.3 02/16/2023     Assessment & Plan:   Problem List Items Addressed This Visit       HTN (hypertension)    Remains well-controlled with olmesartan and carvedilol.  No medication changes today.      Emphysema of lung (HCC)    No formal diagnosis of COPD.  Emphysematous changes have been noted on prior CT imaging of the chest.  He has been treated twice within the last month for respiratory infections.  He has an extensive smoking history and continues to smoke 1 pack/day of cigarettes.  Currently using an albuterol inhaler as needed for shortness of breath. -PFTs ordered today -Continue albuterol use as needed -If PFTs are suggestive of COPD, add maintenance inhaler      Nightmares associated with chronic post-traumatic stress disorder    Acute concern today is vivid nightmares that occur 2-3 nights per week.  His wife reports that he will occasionally yell out and tends to act out his dreams.  She is concerned for her safety. Harold Brady endorses a traumatic psychiatric history of watching his best friend drowned and a close friend passed 2 years ago.  Suspect that there is a component of PTSD contributing to his nightmares. -Prazosin prescribed today for treatment of nightmares associated with PTSD -He will return to care if his symptoms worsen or fail to improve      Hyperlipemia    Lipid panel updated last month.  Total cholesterol 112 and LDL 57.  Adequately controlled on current regimen of Vascepa, rosuvastatin, and ezetimibe.      Tobacco abuse    He continues to smoke 6-8 cigarettes/day and has accumulated at least a 40-pack-year smoking history.   He remains precontemplative with regards to complete cessation. -PFTs ordered for further workup of suspected COPD -Lung cancer screening referral placed today.  LDCT last completed in July 2023. -The patient was counseled on the dangers of tobacco use, and was advised to quit.  Reviewed strategies to maximize success, including removing cigarettes and smoking materials from environment, stress management, substitution of other forms of reinforcement, support of family/friends, and written materials.       Return in about 6 months (around 09/18/2023).   Billie Lade, MD

## 2023-03-18 NOTE — Assessment & Plan Note (Signed)
He continues to smoke 6-8 cigarettes/day and has accumulated at least a 40-pack-year smoking history.  He remains precontemplative with regards to complete cessation. -PFTs ordered for further workup of suspected COPD -Lung cancer screening referral placed today.  LDCT last completed in July 2023. -The patient was counseled on the dangers of tobacco use, and was advised to quit.  Reviewed strategies to maximize success, including removing cigarettes and smoking materials from environment, stress management, substitution of other forms of reinforcement, support of family/friends, and written materials.

## 2023-03-18 NOTE — Assessment & Plan Note (Signed)
Lipid panel updated last month.  Total cholesterol 112 and LDL 57.  Adequately controlled on current regimen of Vascepa, rosuvastatin, and ezetimibe.

## 2023-03-18 NOTE — Assessment & Plan Note (Signed)
Remains well-controlled with olmesartan and carvedilol.  No medication changes today.

## 2023-03-18 NOTE — Assessment & Plan Note (Signed)
No formal diagnosis of COPD.  Emphysematous changes have been noted on prior CT imaging of the chest.  He has been treated twice within the last month for respiratory infections.  He has an extensive smoking history and continues to smoke 1 pack/day of cigarettes.  Currently using an albuterol inhaler as needed for shortness of breath. -PFTs ordered today -Continue albuterol use as needed -If PFTs are suggestive of COPD, add maintenance inhaler

## 2023-04-27 ENCOUNTER — Other Ambulatory Visit: Payer: Self-pay | Admitting: *Deleted

## 2023-04-27 DIAGNOSIS — Z87891 Personal history of nicotine dependence: Secondary | ICD-10-CM

## 2023-04-27 DIAGNOSIS — Z122 Encounter for screening for malignant neoplasm of respiratory organs: Secondary | ICD-10-CM

## 2023-04-27 DIAGNOSIS — F17210 Nicotine dependence, cigarettes, uncomplicated: Secondary | ICD-10-CM

## 2023-04-29 NOTE — Progress Notes (Signed)
Cardiology Office Note:    Date:  04/29/2023   ID:  Harold Brady, DOB 05-Jul-1951, MRN [id number]  PCP:  Billie Lade, MD   [facility] Providers Cardiologist:  Charlton Haws, MD     Referring MD: Billie Lade, MD    History of Present Illness:    Harold Brady is a very pleasant 72 y.o. male with a hx of   Coronary artery disease  CAC score in 3/21: 1312 (93 rd percentile) ETT 3/21: abnormal Cath 3/21: mod LAD and LCx disease - Med Rx  Hypertension  Hyperlipidemia  +Cigs  Venous insufficiency  ABIs in 11/19: normal  He was seen in our office on 10/02/2021 by Tereso Newcomer, PA.  He reported recent shortness of breath, no chest pain.  Echocardiogram was ordered for evaluation and revealed normal LVEF 60 to 65%, no RWMA, mild LVH, G1 DD, mild calcification of the aortic valve with no evidence of stenosis.  Lipid panel in March 2023 revealed LDL 110, above goal of 70 and he was asked to increase rosuvastatin to 40 mg daily and continue ezetimibe 10 mg daily.    Seen by PA 03/14/22 for LLE edema after returning form fishing trip [place] negative for DVT just chronic venous insufficieny  Bad dreams and acts out at night with some physicality with wife On Requip and Citalopram improved when taking  COVID positive 09/2022  Seen in ED May 2024 for bronchitisCOPD and had course of steroids and antibiotics Still smoking a ppd  Discussed need to see ENT for voice change Make sure no polyps or other issues Most likely bronchitic Discussed using 21 mg nicotine patch   Past Medical History:  Diagnosis Date   HTN (hypertension)    Echocardiogram 1/23: EF 60-65, no RWMA, mild LVH, Gr 1 DD, normal RVSF, AV sclerosis w/o AS   Hyperlipemia    Mild emphysema (HCC)    Moderate 2v Coronary Artery Disease  10/01/2021   CAC score in 3/21: 1312 (93 rd percentile) // ETT 3/21: abnormal  // Cath 3/21: mod LAD and LCx disease - Med Rx    Pericardial effusion    Periodontal disease    Plantar  fasciitis     Past Surgical History:  Procedure Laterality Date   BACK SURGERY     L4,L5   LEFT HEART CATH AND CORONARY ANGIOGRAPHY N/A 01/24/2020   Procedure: LEFT HEART CATH AND CORONARY ANGIOGRAPHY;  Surgeon: Lyn Records, MD;  Location: [facility];  Service: Cardiovascular;  Laterality: N/A;    Current Medications: No outpatient medications have been marked as taking for the 05/11/23 encounter (Appointment) with Wendall Stade, MD.   Current Facility-Administered Medications for the 05/11/23 encounter (Appointment) with Wendall Stade, MD  Medication   sodium chloride flush (NS) 0.9 % injection 3 mL     Allergies:   Penicillins, Procaine, Lipitor [atorvastatin calcium], and Niaspan [niacin er]   Social History   Socioeconomic History   Marital status: Married    Spouse name: Not on file   Number of children: Not on file   Years of education: Not on file   Highest education level: Not on file  Occupational History   Not on file  Tobacco Use   Smoking status: Every Day    Packs/day: 1.00    Years: 40.00    Additional pack years: 0.00    Total pack years: 40.00    Types: Cigarettes    Start date: 09/09/1965  Smokeless tobacco: Never  Vaping Use   Vaping Use: Never used  Substance and Sexual Activity   Alcohol use: Not Currently    Comment: OCCASIONAL BEER   Drug use: Not on file   Sexual activity: Not on file  Other Topics Concern   Not on file  Social History Narrative   Not on file   Social Determinants of Health   Financial Resource Strain: Not on file  Food Insecurity: Not on file  Transportation Needs: Not on file  Physical Activity: Not on file  Stress: Not on file  Social Connections: Not on file     Family History: The patient's family history includes Breast cancer in his paternal grandmother; Cancer in his mother; Colon cancer in his father; Coronary artery disease in his mother; Diabetes in his maternal grandfather and another  family member; Heart failure in his maternal grandfather; Hypertension in his brother and father; Lead poisoning in his father.  ROS:   Please see the history of present illness.    +LLE edema +burning in bilateral legs All other systems reviewed and are negative.  Labs/Other Studies Reviewed:    The following studies were reviewed today:  Echo 11/05/21  1. Left ventricular ejection fraction, by estimation, is 60 to 65%. The  left ventricle has normal function. The left ventricle has no regional  wall motion abnormalities. There is mild left ventricular hypertrophy.  Left ventricular diastolic parameters  are consistent with Grade I diastolic dysfunction (impaired relaxation).   2. Right ventricular systolic function is normal. The right ventricular  size is normal.   3. The mitral valve is normal in structure. No evidence of mitral valve  regurgitation. No evidence of mitral stenosis.   4. The aortic valve is tricuspid. There is mild calcification of the  aortic valve. There is mild thickening of the aortic valve. Aortic valve  regurgitation is not visualized. Aortic valve sclerosis is present, with  no evidence of aortic valve stenosis.   5. The inferior vena cava is normal in size with greater than 50%  respiratory variability, suggesting right atrial pressure of 3 mmHg.    LHC 01/24/20  Cardiac catheterization 01/24/20 LAD prox 65, mid 70 LCx ost 40, prox 50, mid 75 RCA mid 25 EF 55-65    ETT 01/17/20  Blood pressure demonstrated a hypertensive response to exercise. Upsloping ST segment depression ST segment depression of 1 mm was noted during stress in the V5 and V6 leads.   Positive ETT with 1 mm upsloping ST depression in recovery Severely HTN response to exercise Patient only achieved 79% PMHR with stress Couplets / PVCls with stress and recovery    CT Cardiac Scoring 01/17/20  Coronary calcium score of 1312. This was 22 rd percentile for age and sex matched  control.   Given how high score is would consider f/u perfusion imaging  Recent Labs: 02/16/2023: ALT 18; BUN 13; Creatinine, Ser 1.04; Hemoglobin 14.9; Platelets 252; Potassium 4.8; Sodium 141; TSH 0.578  Recent Lipid Panel    Component Value Date/Time   CHOL 112 02/16/2023 1008   TRIG 87 02/16/2023 1008   HDL 38 (L) 02/16/2023 1008   CHOLHDL 2.9 02/16/2023 1008   CHOLHDL 3.6 08/27/2022 0947   VLDL 13 08/27/2022 0947   LDLCALC 57 02/16/2023 1008     Risk Assessment/Calculations:       Physical Exam:    VS:  There were no vitals taken for this visit.    Wt Readings from Last  3 Encounters:  03/18/23 210 lb 9.6 oz (95.5 kg)  02/16/23 208 lb 9.6 oz (94.6 kg)  06/17/22 211 lb 9.6 oz (96 kg)    Affect appropriate Chronically ill COPDer HEENT: normal Neck supple with no adenopathy JVP normal no bruits no thyromegaly Lungs clear with no wheezing and good diaphragmatic motion Heart:  S1/S2 no murmur, no rub, gallop or click PMI normal Abdomen: benighn, BS positve, no tenderness, no AAA no bruit.  No HSM or HJR Distal pulses intact with no bruits Bilateral below knee varicosities with plus one edema  Neuro non-focal Skin warm and dry No muscular weakness   EKG:  05/11/2023 SR LAD [id number]    Assessment and Plan:     Venous Insufficiency:  chronic discussed seeing Vein and vascular specialist for mapping and possible ablative procedure He is having pain and edema with standing    Claudication: sounds more neuropathic ABI"s ordered in May 2023 not done good pulses on exam defer  CAD without angina: Moderate to moderately severe two-vessel coronary disease in the circumflex and LAD by cardiac catheterization 12/2019, medical management recommended.  He denies chest pain, dyspnea, or other symptoms concerning for angina.  No indication for further ischemic evaluation at this time.   Hypertension: BP is well controlled today. No changes to current medications.    Hyperlipidemia LDL goal < 70:  LDL 85  Crestor increased to 40 mg and Zetia    Tobacco abuse: Quit for 3 weeks but back at < 1 ppd  Lung fields ok on cancer screening CT 05/19/22 will update   Lung cancer screening CT   F/U in a year    Signed, Charlton Haws, MD  04/29/2023 8:31 AM    [facility]

## 2023-05-11 ENCOUNTER — Encounter: Payer: Self-pay | Admitting: Cardiovascular Disease

## 2023-05-11 ENCOUNTER — Ambulatory Visit: Payer: Medicare Other | Attending: Cardiovascular Disease | Admitting: Cardiovascular Disease

## 2023-05-11 ENCOUNTER — Telehealth: Payer: Self-pay

## 2023-05-11 VITALS — BP 122/80 | HR 95 | Ht 74.0 in | Wt 213.5 lb

## 2023-05-11 DIAGNOSIS — Z720 Tobacco use: Secondary | ICD-10-CM | POA: Diagnosis not present

## 2023-05-11 DIAGNOSIS — I83813 Varicose veins of bilateral lower extremities with pain: Secondary | ICD-10-CM | POA: Diagnosis not present

## 2023-05-11 DIAGNOSIS — E782 Mixed hyperlipidemia: Secondary | ICD-10-CM

## 2023-05-11 DIAGNOSIS — I10 Essential (primary) hypertension: Secondary | ICD-10-CM | POA: Diagnosis not present

## 2023-05-11 DIAGNOSIS — I8393 Asymptomatic varicose veins of bilateral lower extremities: Secondary | ICD-10-CM

## 2023-05-11 NOTE — Telephone Encounter (Signed)
Attempt to call for re-scheduling. No answer. Left VM

## 2023-05-11 NOTE — Patient Instructions (Addendum)

## 2023-05-11 NOTE — Telephone Encounter (Signed)
Wife, Consuella Lose, left VM to reschedule sdmv and LDCT appts, as patient will be out of town. Returned call, no answer.  Left VM to call

## 2023-05-25 ENCOUNTER — Ambulatory Visit (HOSPITAL_BASED_OUTPATIENT_CLINIC_OR_DEPARTMENT_OTHER)
Admission: RE | Admit: 2023-05-25 | Discharge: 2023-05-25 | Disposition: A | Discharge status: Home / Self Care | Payer: Medicare Other | Source: Ambulatory Visit | Attending: Acute Care | Admitting: Acute Care

## 2023-05-25 DIAGNOSIS — Z122 Encounter for screening for malignant neoplasm of respiratory organs: Secondary | ICD-10-CM | POA: Diagnosis not present

## 2023-05-25 DIAGNOSIS — Z87891 Personal history of nicotine dependence: Secondary | ICD-10-CM | POA: Diagnosis not present

## 2023-05-25 DIAGNOSIS — F17210 Nicotine dependence, cigarettes, uncomplicated: Secondary | ICD-10-CM | POA: Insufficient documentation

## 2023-05-29 ENCOUNTER — Telehealth: Payer: Self-pay | Admitting: Internal Medicine

## 2023-05-29 NOTE — Telephone Encounter (Signed)
The test result is not even back yet. Someone will call once the result is back and its reviewed

## 2023-05-29 NOTE — Telephone Encounter (Signed)
Patient wife is calling wanting to speak with a nurse regarding recent lung CAT scan results, says she is unable to understand some of it. Please advise Thank You

## 2023-06-01 ENCOUNTER — Other Ambulatory Visit: Payer: Self-pay | Admitting: Cardiovascular Disease

## 2023-06-01 ENCOUNTER — Other Ambulatory Visit: Payer: Self-pay | Admitting: Acute Care

## 2023-06-01 DIAGNOSIS — F17210 Nicotine dependence, cigarettes, uncomplicated: Secondary | ICD-10-CM

## 2023-06-01 DIAGNOSIS — Z122 Encounter for screening for malignant neoplasm of respiratory organs: Secondary | ICD-10-CM

## 2023-06-01 DIAGNOSIS — Z87891 Personal history of nicotine dependence: Secondary | ICD-10-CM

## 2023-06-03 ENCOUNTER — Encounter (INDEPENDENT_AMBULATORY_CARE_PROVIDER_SITE_OTHER): Payer: Medicare Other | Admitting: Primary Care

## 2023-06-03 ENCOUNTER — Telehealth: Payer: Self-pay

## 2023-06-03 NOTE — Patient Instructions (Signed)

## 2023-06-03 NOTE — Telephone Encounter (Addendum)
Attempted to call patient for Lung Cancer Screening/Shared Decision Visit.  Phone goes straight to voice mail. Left voice mail.  Called x 2.  Tried to call patient's wife's number.  Left message on voice mail.  Informed Ames Dura, NP.  Documented in chart unable to contact patient.

## 2023-06-03 NOTE — Progress Notes (Signed)
 This encounter was created in error - please disregard.

## 2023-06-04 ENCOUNTER — Ambulatory Visit (HOSPITAL_COMMUNITY): Payer: Medicare Other

## 2023-06-11 DIAGNOSIS — H4911 Fourth [trochlear] nerve palsy, right eye: Secondary | ICD-10-CM | POA: Diagnosis not present

## 2023-06-11 DIAGNOSIS — H524 Presbyopia: Secondary | ICD-10-CM | POA: Diagnosis not present

## 2023-06-19 ENCOUNTER — Ambulatory Visit (INDEPENDENT_AMBULATORY_CARE_PROVIDER_SITE_OTHER): Payer: Medicare Other | Admitting: Pulmonary Disease

## 2023-06-19 DIAGNOSIS — J439 Emphysema, unspecified: Secondary | ICD-10-CM | POA: Diagnosis not present

## 2023-06-19 LAB — PULMONARY FUNCTION TEST
DL/VA % pred: 77 %
DL/VA: 3.09 ml/min/mmHg/L
DLCO cor % pred: 33 %
DLCO cor: 9.99 ml/min/mmHg
DLCO unc % pred: 33 %
DLCO unc: 9.99 ml/min/mmHg
FEF 25-75 Post: 1.04 L/s
FEF 25-75 Pre: 0.9 L/s
FEF2575-%Change-Post: 15 %
FEF2575-%Pred-Post: 35 %
FEF2575-%Pred-Pre: 30 %
FEV1-%Change-Post: 5 %
FEV1-%Pred-Post: 44 %
FEV1-%Pred-Pre: 41 %
FEV1-Post: 1.69 L
FEV1-Pre: 1.6 L
FEV1FVC-%Change-Post: 1 %
FEV1FVC-%Pred-Pre: 60 %
FEV6-%Change-Post: 5 %
FEV6-%Pred-Post: 72 %
FEV6-%Pred-Pre: 68 %
FEV6-Post: 3.54 L
FEV6-Pre: 3.37 L
FEV6FVC-%Change-Post: 1 %
FEV6FVC-%Pred-Post: 101 %
FEV6FVC-%Pred-Pre: 100 %
FVC-%Change-Post: 3 %
FVC-%Pred-Post: 72 %
FVC-%Pred-Pre: 69 %
FVC-Post: 3.72 L
FVC-Pre: 3.58 L
Post FEV1/FVC ratio: 45 %
Post FEV6/FVC ratio: 96 %
Pre FEV1/FVC ratio: 45 %
Pre FEV6/FVC Ratio: 95 %
RV % pred: 129 %
RV: 3.43 L
TLC % pred: 106 %
TLC: 8.32 L

## 2023-06-19 NOTE — Progress Notes (Signed)
Full PFT performed today. °

## 2023-06-19 NOTE — Patient Instructions (Signed)
Full PFT performed today. °

## 2023-06-22 ENCOUNTER — Telehealth: Payer: Self-pay | Admitting: Internal Medicine

## 2023-06-22 NOTE — Telephone Encounter (Signed)
Patient wife calling requesting a call back regarding pt inhaler Rx. Please advise Thank you

## 2023-06-22 NOTE — Telephone Encounter (Signed)
Spoke with patient's wife.

## 2023-07-07 ENCOUNTER — Encounter: Payer: Self-pay | Admitting: Internal Medicine

## 2023-08-23 ENCOUNTER — Other Ambulatory Visit: Payer: Self-pay | Admitting: Cardiovascular Disease

## 2023-09-11 ENCOUNTER — Encounter: Payer: Self-pay | Admitting: Internal Medicine

## 2023-09-11 ENCOUNTER — Ambulatory Visit (INDEPENDENT_AMBULATORY_CARE_PROVIDER_SITE_OTHER): Payer: Medicare Other | Admitting: Internal Medicine

## 2023-09-11 VITALS — BP 162/90 | HR 69 | Resp 16 | Ht 74.0 in | Wt 214.0 lb

## 2023-09-11 DIAGNOSIS — J439 Emphysema, unspecified: Secondary | ICD-10-CM | POA: Diagnosis not present

## 2023-09-11 DIAGNOSIS — Z2821 Immunization not carried out because of patient refusal: Secondary | ICD-10-CM

## 2023-09-11 DIAGNOSIS — F515 Nightmare disorder: Secondary | ICD-10-CM

## 2023-09-11 DIAGNOSIS — F4312 Post-traumatic stress disorder, chronic: Secondary | ICD-10-CM

## 2023-09-11 DIAGNOSIS — R7303 Prediabetes: Secondary | ICD-10-CM | POA: Diagnosis not present

## 2023-09-11 MED ORDER — ALBUTEROL SULFATE HFA 108 (90 BASE) MCG/ACT IN AERS
2.0000 | INHALATION_SPRAY | RESPIRATORY_TRACT | 0 refills | Status: AC | PRN
Start: 2023-09-11 — End: ?

## 2023-09-11 NOTE — Progress Notes (Signed)
Established Patient Office Visit  Subjective   Patient ID: Harold Brady, male    DOB: 12/20/1950  Age: 72 y.o. MRN: [id number]  Chief Complaint  Patient presents with   Follow-up   Harold Brady returns to care today for routine follow-up.  He was last evaluated by me on 5/22.  Prazosin was prescribed for treatment of nightmares in the setting of PTSD.  PFTs were ordered as well.  In the interim, he has been evaluated by cardiology and pulmonology.  PFTs were consistent with severe obstructive airway disease.  Breztri was prescribed.  There have otherwise been no acute interval events. [name] reports feeling fairly well today.  He does not have any acute concerns to discuss and is celebrating his 72th birthday.  He reports that he tried taking Breztri but quickly developed oral thrush.  Past Medical History:  Diagnosis Date   HTN (hypertension)    Echocardiogram 1/23: EF 60-65, no RWMA, mild LVH, Gr 1 DD, normal RVSF, AV sclerosis w/o AS   Hyperlipemia    Mild emphysema (HCC)    Moderate 2v Coronary Artery Disease  10/01/2021   CAC score in 3/21: 1312 (93 rd percentile) // ETT 3/21: abnormal  // Cath 3/21: mod LAD and LCx disease - Med Rx    Pericardial effusion    Periodontal disease    Plantar fasciitis    Past Surgical History:  Procedure Laterality Date   BACK SURGERY     L4,L5   LEFT HEART CATH AND CORONARY ANGIOGRAPHY N/A 01/24/2020   Procedure: LEFT HEART CATH AND CORONARY ANGIOGRAPHY;  Surgeon: Lyn Records, MD;  Location: [facility];  Service: Cardiovascular;  Laterality: N/A;   Social History   Tobacco Use   Smoking status: Every Day    Current packs/day: 1.00    Average packs/day: 1 pack/day for 58.0 years (58.0 ttl pk-yrs)    Types: Cigarettes    Start date: 09/09/1965   Smokeless tobacco: Never  Vaping Use   Vaping status: Never Used  Substance Use Topics   Alcohol use: Not Currently    Comment: OCCASIONAL BEER   Family History  Problem  Relation Age of Onset   Coronary artery disease Mother    Cancer Mother        GALLBLADDER   Colon cancer Father    Hypertension Father    Lead poisoning Father        Asbestos   Hypertension Brother    Diabetes Maternal Grandfather    Heart failure Maternal Grandfather        Open heart surgery   Breast cancer Paternal Grandmother    Diabetes Other    Allergies  Allergen Reactions   Penicillins Hives and Other (See Comments)    Reaction as a child-unsure of what happens   Procaine Hives   Lipitor [Atorvastatin Calcium] Other (See Comments)    MUSCLE ACHES, GI UPSET   Niaspan [Niacin Er (Antihyperlipidemic)] Itching and Other (See Comments)    FLUSHING   Review of Systems  Constitutional:  Negative for chills and fever.  HENT:  Negative for sore throat.   Respiratory:  Negative for cough and shortness of breath.   Cardiovascular:  Negative for chest pain, palpitations and leg swelling.  Gastrointestinal:  Negative for abdominal pain, blood in stool, constipation, diarrhea, nausea and vomiting.  Genitourinary:  Negative for dysuria and hematuria.  Musculoskeletal:  Negative for myalgias.  Skin:  Negative for itching and rash.  Neurological:  Negative for dizziness and headaches.  Psychiatric/Behavioral:  Negative for depression and suicidal ideas.      Objective:     BP (!) 162/90   Pulse 69   Resp 16   Ht 6\' 2"  (1.88 m)   Wt 214 lb (97.1 kg)   SpO2 96%   BMI 27.48 kg/m  BP Readings from Last 3 Encounters:  09/11/23 (!) 162/90  05/11/23 122/80  03/18/23 111/69   Physical Exam Vitals reviewed.  Constitutional:      General: He is not in acute distress.    Appearance: Normal appearance. He is not ill-appearing.  HENT:     Head: Normocephalic and atraumatic.     Right Ear: External ear normal.     Left Ear: External ear normal.     Nose: Nose normal. No congestion or rhinorrhea.     Mouth/Throat:     Mouth: Mucous membranes are moist.     Pharynx:  Oropharynx is clear.  Eyes:     General: No scleral icterus.    Extraocular Movements: Extraocular movements intact.     Conjunctiva/sclera: Conjunctivae normal.     Pupils: Pupils are equal, round, and reactive to light.  Cardiovascular:     Rate and Rhythm: Normal rate and regular rhythm.     Pulses: Normal pulses.     Heart sounds: Normal heart sounds. No murmur heard. Pulmonary:     Effort: Pulmonary effort is normal.     Breath sounds: Normal breath sounds. No wheezing, rhonchi or rales.  Abdominal:     General: Abdomen is flat. Bowel sounds are normal. There is no distension.     Palpations: Abdomen is soft.     Tenderness: There is no abdominal tenderness.  Musculoskeletal:        General: No swelling or deformity. Normal range of motion.     Cervical back: Normal range of motion.  Skin:    General: Skin is warm and dry.     Capillary Refill: Capillary refill takes less than 2 seconds.  Neurological:     General: No focal deficit present.     Mental Status: He is alert and oriented to person, place, and time.     Motor: No weakness.  Psychiatric:        Mood and Affect: Mood normal.        Behavior: Behavior normal.        Thought Content: Thought content normal.   Last CBC Lab Results  Component Value Date   WBC 10.4 02/16/2023   HGB 14.9 02/16/2023   HCT 45.2 02/16/2023   MCV 89 02/16/2023   MCH 29.4 02/16/2023   RDW 13.6 02/16/2023   PLT 252 02/16/2023   Last metabolic panel Lab Results  Component Value Date   GLUCOSE 97 02/16/2023   NA 141 02/16/2023   K 4.8 02/16/2023   CL 104 02/16/2023   CO2 23 02/16/2023   BUN 13 02/16/2023   CREATININE 1.04 02/16/2023   EGFR 78 02/16/2023   CALCIUM 9.1 02/16/2023   PROT 6.6 02/16/2023   ALBUMIN 4.1 02/16/2023   LABGLOB 2.5 02/16/2023   AGRATIO 1.6 02/16/2023   BILITOT 0.5 02/16/2023   ALKPHOS 84 02/16/2023   AST 19 02/16/2023   ALT 18 02/16/2023   Last lipids Lab Results  Component Value Date   CHOL  112 02/16/2023   HDL 38 (L) 02/16/2023   LDLCALC 57 02/16/2023   TRIG 87 02/16/2023   CHOLHDL 2.9 02/16/2023  Last hemoglobin A1c Lab Results  Component Value Date   HGBA1C 6.1 (H) 02/16/2023   Last thyroid functions Lab Results  Component Value Date   TSH 0.578 02/16/2023   Last vitamin D Lab Results  Component Value Date   VD25OH 29.0 (L) 02/16/2023   Last vitamin B12 and Folate Lab Results  Component Value Date   VITAMINB12 327 02/16/2023   FOLATE 17.3 02/16/2023     Assessment & Plan:   Problem List Items Addressed This Visit       COPD (chronic obstructive pulmonary disease) (HCC) - Primary    PFTs demonstrated severe obstructive airway disease with severe diffusion defect.  Breztri was prescribed as a maintenance inhaler.  He states that he tried using this but has stopped after developing oral thrush.  He is also prescribed an albuterol inhaler for rescue use but is currently out.  Pulmonary exam is unremarkable today.  He is asymptomatic. -Albuterol refilled for as needed use.  We discussed resuming Breztri as maintenance therapy.  Proper techniques for use were reviewed, including steps to prevent thrush.  He will consider resuming Breztri and notify us when a refill is needed.  He continues to smoke close to 1 pack/day of cigarettes and is precontemplative with regards to cessation.      Nightmares associated with chronic post-traumatic stress disorder    Prazosin was added at his last appointment after he endorsed vivid nightmares in the setting of PTSD.  This has been effective.      Prediabetes    A1c 6.1 on labs from April.  Dietary changes aimed at lowering his blood sugar were reviewed again today.      Return in about 6 months (around 03/10/2024).   Billie Lade, MD

## 2023-09-11 NOTE — Assessment & Plan Note (Signed)
PFTs demonstrated severe obstructive airway disease with severe diffusion defect.  Breztri was prescribed as a maintenance inhaler.  He states that he tried using this but has stopped after developing oral thrush.  He is also prescribed an albuterol inhaler for rescue use but is currently out.  Pulmonary exam is unremarkable today.  He is asymptomatic. -Albuterol refilled for as needed use.  We discussed resuming Breztri as maintenance therapy.  Proper techniques for use were reviewed, including steps to prevent thrush.  He will consider resuming Breztri and notify us when a refill is needed.  He continues to smoke close to 1 pack/day of cigarettes and is precontemplative with regards to cessation.

## 2023-09-11 NOTE — Patient Instructions (Signed)
It was a pleasure to see you today.  Thank you for giving [place] the opportunity to be involved in your care.  Below is a brief recap of your visit and next steps.  We will plan to see you again in 6 months.  Summary No medication changes today. Albuterol refilled. I recommend resuming Breztri. Follow up in 6 months Happy Birthday!

## 2023-09-11 NOTE — Assessment & Plan Note (Signed)
A1c 6.1 on labs from April.  Dietary changes aimed at lowering his blood sugar were reviewed again today.

## 2023-09-11 NOTE — Assessment & Plan Note (Signed)
Prazosin was added at his last appointment after he endorsed vivid nightmares in the setting of PTSD.  This has been effective.

## 2023-11-30 DIAGNOSIS — M79642 Pain in left hand: Secondary | ICD-10-CM | POA: Diagnosis not present

## 2023-11-30 DIAGNOSIS — G5622 Lesion of ulnar nerve, left upper limb: Secondary | ICD-10-CM | POA: Diagnosis not present

## 2023-11-30 DIAGNOSIS — G5621 Lesion of ulnar nerve, right upper limb: Secondary | ICD-10-CM | POA: Diagnosis not present

## 2023-11-30 DIAGNOSIS — M79641 Pain in right hand: Secondary | ICD-10-CM | POA: Diagnosis not present

## 2023-12-09 ENCOUNTER — Other Ambulatory Visit: Payer: Self-pay | Admitting: Internal Medicine

## 2023-12-11 ENCOUNTER — Other Ambulatory Visit: Payer: Self-pay | Admitting: Internal Medicine

## 2023-12-15 ENCOUNTER — Ambulatory Visit
Admission: EM | Admit: 2023-12-15 | Discharge: 2023-12-15 | Disposition: A | Discharge status: Home / Self Care | Payer: Medicare Other | Attending: Nurse Practitioner | Admitting: Nurse Practitioner

## 2023-12-15 ENCOUNTER — Encounter: Payer: Self-pay | Admitting: *Deleted

## 2023-12-15 DIAGNOSIS — J069 Acute upper respiratory infection, unspecified: Secondary | ICD-10-CM

## 2023-12-15 LAB — POC COVID19/FLU A&B COMBO
Covid Antigen, POC: NEGATIVE
Influenza A Antigen, POC: NEGATIVE
Influenza B Antigen, POC: NEGATIVE

## 2023-12-15 MED ORDER — PROMETHAZINE-DM 6.25-15 MG/5ML PO SYRP: 2.5000 mL | ORAL_SOLUTION | Freq: Every evening | ORAL | 0 refills | Status: DC | PRN

## 2023-12-15 NOTE — ED Triage Notes (Signed)
Pts wife states that pt has had cough and chills for 2 days. Wife gave him some of the cough meds she had from the flu.

## 2023-12-15 NOTE — Discharge Instructions (Signed)
You have a viral upper respiratory infection.  Symptoms should improve over the next week to 10 days.  If you develop chest pain or shortness of breath, go to the emergency room.  COVID-19 and influenza test is negative  Some things that can make you feel better are: - Increased rest - Increasing fluid with water/sugar free electrolytes - Acetaminophen and ibuprofen as needed for fever/pain - Salt water gargling, chloraseptic spray and throat lozenges - OTC guaifenesin (Mucinex) 600 mg twice daily for congestion - Saline sinus flushes or a neti pot - Humidifying the air - cough syrup at night time as needed

## 2023-12-15 NOTE — ED Provider Notes (Signed)
[facility]    CSN: [id number] Arrival date & time: 12/15/23  0945      History   Chief Complaint Chief Complaint  Patient presents with   Cough   Chills    HPI Harold Brady is a 73 y.o. male.   Patient presents today with wife for 2-day history of chills, congested cough when he lays down, runny and stuffy nose, and fatigue.  No fever, chest pain, shortness of breath, sore throat, headache, or ear pain.  No abdominal pain, nausea/vomiting, or diarrhea.  No change in appetite.  No known sick contacts.  Wife gave him a dose of her prescription cough medicine which did help with the coughing.  She does not remember the name of the medicine.    Past Medical History:  Diagnosis Date   HTN (hypertension)    Echocardiogram 1/23: EF 60-65, no RWMA, mild LVH, Gr 1 DD, normal RVSF, AV sclerosis w/o AS   Hyperlipemia    Mild emphysema (HCC)    Moderate 2v Coronary Artery Disease  10/01/2021   CAC score in 3/21: 1312 (93 rd percentile) // ETT 3/21: abnormal  // Cath 3/21: mod LAD and LCx disease - Med Rx    Pericardial effusion    Periodontal disease    Plantar fasciitis     Patient Active Problem List   Diagnosis Date Noted   Prediabetes 09/11/2023   Nightmares associated with chronic post-traumatic stress disorder 03/18/2023   COPD (chronic obstructive pulmonary disease) (HCC) 03/18/2023   Screening for malignant neoplasm of colon 03/15/2023   Constipation 03/15/2023   Family history of colon cancer 03/15/2023   History of colonic polyps 03/15/2023   Flatulence, eructation and gas pain 03/15/2023   Left lower quadrant pain 03/15/2023   Acute bronchitis 02/16/2023   Osteoarthritis of left glenohumeral joint 12/16/2022   Pain in joint of left shoulder 12/12/2022   Pain in joint of right knee 12/09/2022   Restless leg syndrome 06/18/2022   Preventative health care 06/18/2022   Shortness of breath 10/02/2021   Moderate 2v Coronary Artery Disease  10/01/2021    Abnormal stress test    Tobacco abuse 08/22/2015   Edema 08/22/2015   SOB (shortness of breath) 08/22/2015   Chronic sinusitis 08/26/2011   Pericardial effusion    HTN (hypertension)    Hyperlipemia     Past Surgical History:  Procedure Laterality Date   BACK SURGERY     L4,L5   LEFT HEART CATH AND CORONARY ANGIOGRAPHY N/A 01/24/2020   Procedure: LEFT HEART CATH AND CORONARY ANGIOGRAPHY;  Surgeon: Lyn Records, MD;  Location: [facility];  Service: Cardiovascular;  Laterality: N/A;       Home Medications    Prior to Admission medications   Medication Sig Start Date End Date Taking? Authorizing Provider  carvedilol (COREG) 3.125 MG tablet TAKE (1) TABLET BY MOUTH TWICE DAILY. 08/24/23  Yes Wendall Stade, MD  citalopram (CELEXA) 20 MG tablet TAKE (1) TABLET BY MOUTH AT BEDTIME. 12/09/23  Yes Billie Lade, MD  diclofenac (VOLTAREN) 75 MG EC tablet TAKE (1) TABLET BY MOUTH TWICE DAILY. 12/09/23  Yes Billie Lade, MD  ezetimibe (ZETIA) 10 MG tablet Take 10 mg by mouth at bedtime.   Yes [provider]  Multiple Vitamin (MULTIVITAMIN WITH MINERALS) TABS tablet Take 1 tablet by mouth 2 (two) times a week.   Yes [provider]  olmesartan (BENICAR) 40 MG tablet TAKE ONE TABLET BY MOUTH  EVERYDAY AT BEDTIME 12/09/23  Yes Billie Lade, MD  promethazine-dextromethorphan (PROMETHAZINE-DM) 6.25-15 MG/5ML syrup Take 2.5 mLs by mouth at bedtime as needed for cough. Do not take with alcohol or while driving or operating heavy machinery.  May cause drowsiness. 12/15/23  Yes Cathlean Marseilles A, NP  rOPINIRole (REQUIP) 0.25 MG tablet TAKE 1-2 TABLETS 1-3 HOURS BEFORE BEDTIME. 12/09/23  Yes Billie Lade, MD  rosuvastatin (CRESTOR) 10 MG tablet Take 1 tablet (10 mg total) by mouth daily. 12/11/23  Yes Billie Lade, MD  VASCEPA 1 g capsule TAKE (2) CAPSULES BY MOUTH TWICE DAILY. 06/02/23  Yes Wendall Stade, MD  Acetaminophen (TYLENOL EXTRA STRENGTH PO) Take 500  mg by mouth as needed.    [provider]  albuterol (VENTOLIN HFA) 108 (90 Base) MCG/ACT inhaler Inhale 2 puffs into the lungs every 4 (four) hours as needed for wheezing or shortness of breath. 09/11/23   Billie Lade, MD  nitroGLYCERIN (NITROSTAT) 0.4 MG SL tablet Place 1 tablet (0.4 mg total) under the tongue every 5 (five) minutes as needed for chest pain. Up to 3 tablets 5 minutes apart. 02/06/20 03/18/23  Wendall Stade, MD  prazosin (MINIPRESS) 1 MG capsule Take 1 capsule (1 mg total) by mouth at bedtime for 60 doses. 03/18/23 05/17/23  Billie Lade, MD  rosuvastatin (CRESTOR) 40 MG tablet Take 1 tablet (40 mg total) by mouth daily. 01/15/22 09/11/23  Beatrice Lecher, PA-C    Family History Family History  Problem Relation Age of Onset   Coronary artery disease Mother    Cancer Mother        GALLBLADDER   Colon cancer Father    Hypertension Father    Lead poisoning Father        Asbestos   Hypertension Brother    Diabetes Maternal Grandfather    Heart failure Maternal Grandfather        Open heart surgery   Breast cancer Paternal Grandmother    Diabetes Other     Social History Social History   Tobacco Use   Smoking status: Every Day    Current packs/day: 1.00    Average packs/day: 1 pack/day for 58.3 years (58.3 ttl pk-yrs)    Types: Cigarettes    Start date: 09/09/1965   Smokeless tobacco: Never  Vaping Use   Vaping status: Never Used  Substance Use Topics   Alcohol use: Not Currently    Comment: OCCASIONAL BEER   Drug use: Not Currently     Allergies   Penicillins, Procaine, Lipitor [atorvastatin calcium], and Niaspan [niacin er (antihyperlipidemic)]   Review of Systems Review of Systems Per HPI  Physical Exam Triage Vital Signs ED Triage Vitals  Encounter Vitals Group     BP 12/15/23 1210 (!) 160/92     Systolic BP Percentile --      Diastolic BP Percentile --      Pulse Rate 12/15/23 1210 63     Resp 12/15/23 1210 18     Temp  12/15/23 1210 98.4 F (36.9 C)     Temp Source 12/15/23 1210 Oral     SpO2 12/15/23 1210 95 %     Weight --      Height --      Head Circumference --      Peak Flow --      Pain Score 12/15/23 1209 0     Pain Loc --      Pain Education --  Exclude from Growth Chart --    No data found.  Updated Vital Signs BP (!) 160/92 (BP Location: Right Arm)   Pulse 63   Temp 98.4 F (36.9 C) (Oral)   Resp 18   SpO2 95%   BP recheck: 121/79  Visual Acuity Right Eye Distance:   Left Eye Distance:   Bilateral Distance:    Right Eye Near:   Left Eye Near:    Bilateral Near:     Physical Exam Vitals and nursing note reviewed.  Constitutional:      General: He is not in acute distress.    Appearance: Normal appearance. He is not ill-appearing or toxic-appearing.  HENT:     Head: Normocephalic and atraumatic.     Right Ear: Tympanic membrane, ear canal and external ear normal.     Left Ear: Tympanic membrane, ear canal and external ear normal.     Nose: No congestion or rhinorrhea.     Mouth/Throat:     Mouth: Mucous membranes are moist.     Pharynx: Oropharynx is clear. No oropharyngeal exudate or posterior oropharyngeal erythema.  Eyes:     General: No scleral icterus.    Extraocular Movements: Extraocular movements intact.  Cardiovascular:     Rate and Rhythm: Normal rate and regular rhythm.  Pulmonary:     Effort: Pulmonary effort is normal. No respiratory distress.     Breath sounds: Normal breath sounds. No wheezing, rhonchi or rales.  Musculoskeletal:     Cervical back: Normal range of motion and neck supple.  Lymphadenopathy:     Cervical: No cervical adenopathy.  Skin:    General: Skin is warm and dry.     Coloration: Skin is not jaundiced or pale.     Findings: No erythema or rash.  Neurological:     Mental Status: He is alert and oriented to person, place, and time.  Psychiatric:        Behavior: Behavior is cooperative.      UC Treatments / Results   Labs (all labs ordered are listed, but only abnormal results are displayed) Labs Reviewed  POC COVID19/FLU A&B COMBO    EKG   Radiology No results found.  Procedures Procedures (including critical care time)  Medications Ordered in UC Medications - No data to display  Initial Impression / Assessment and Plan / UC Course  I have reviewed the triage vital signs and the nursing notes.  Pertinent labs & imaging results that were available during my care of the patient were reviewed by me and considered in my medical decision making (see chart for details).   Patient is well-appearing, normotensive, afebrile, not tachycardic, not tachypneic, oxygenating well on room air.    1. Viral URI with cough Vitals and exam are reassuring today Suspect viral etiology COVID-19, influenza test is negative Supportive care discussed, start low-dose cough suppressant medicine at nighttime as needed given age Return and ER precautions discussed  The patient was given the opportunity to ask questions.  All questions answered to their satisfaction.  The patient is in agreement to this plan.    Final Clinical Impressions(s) / UC Diagnoses   Final diagnoses:  Viral URI with cough     Discharge Instructions      You have a viral upper respiratory infection.  Symptoms should improve over the next week to 10 days.  If you develop chest pain or shortness of breath, go to the emergency room.  COVID-19 and influenza test is negative  Some things that can make you feel better are: - Increased rest - Increasing fluid with water/sugar free electrolytes - Acetaminophen and ibuprofen as needed for fever/pain - Salt water gargling, chloraseptic spray and throat lozenges - OTC guaifenesin (Mucinex) 600 mg twice daily for congestion - Saline sinus flushes or a neti pot - Humidifying the air - cough syrup at night time as needed     ED Prescriptions     Medication Sig Dispense Auth. Provider    promethazine-dextromethorphan (PROMETHAZINE-DM) 6.25-15 MG/5ML syrup Take 2.5 mLs by mouth at bedtime as needed for cough. Do not take with alcohol or while driving or operating heavy machinery.  May cause drowsiness. 118 mL Valentino Nose, NP      PDMP not reviewed this encounter.   Valentino Nose, NP 12/15/23 1258

## 2024-01-05 ENCOUNTER — Other Ambulatory Visit: Payer: Self-pay | Admitting: Internal Medicine

## 2024-02-10 ENCOUNTER — Other Ambulatory Visit: Payer: Self-pay | Admitting: Internal Medicine

## 2024-03-07 ENCOUNTER — Encounter (HOSPITAL_COMMUNITY): Payer: Self-pay

## 2024-03-14 ENCOUNTER — Encounter: Payer: Self-pay | Admitting: Internal Medicine

## 2024-03-14 ENCOUNTER — Ambulatory Visit (INDEPENDENT_AMBULATORY_CARE_PROVIDER_SITE_OTHER): Payer: Medicare Other | Admitting: Internal Medicine

## 2024-03-14 VITALS — BP 139/85 | HR 72 | Ht 74.0 in | Wt 209.6 lb

## 2024-03-14 DIAGNOSIS — F515 Nightmare disorder: Secondary | ICD-10-CM

## 2024-03-14 DIAGNOSIS — R7303 Prediabetes: Secondary | ICD-10-CM | POA: Diagnosis not present

## 2024-03-14 DIAGNOSIS — I2510 Atherosclerotic heart disease of native coronary artery without angina pectoris: Secondary | ICD-10-CM

## 2024-03-14 DIAGNOSIS — K219 Gastro-esophageal reflux disease without esophagitis: Secondary | ICD-10-CM | POA: Insufficient documentation

## 2024-03-14 DIAGNOSIS — E782 Mixed hyperlipidemia: Secondary | ICD-10-CM | POA: Diagnosis not present

## 2024-03-14 DIAGNOSIS — M19012 Primary osteoarthritis, left shoulder: Secondary | ICD-10-CM

## 2024-03-14 DIAGNOSIS — F4312 Post-traumatic stress disorder, chronic: Secondary | ICD-10-CM

## 2024-03-14 DIAGNOSIS — I10 Essential (primary) hypertension: Secondary | ICD-10-CM | POA: Diagnosis not present

## 2024-03-14 DIAGNOSIS — Z125 Encounter for screening for malignant neoplasm of prostate: Secondary | ICD-10-CM

## 2024-03-14 DIAGNOSIS — J449 Chronic obstructive pulmonary disease, unspecified: Secondary | ICD-10-CM | POA: Diagnosis not present

## 2024-03-14 MED ORDER — PRAZOSIN HCL 1 MG PO CAPS: 1.0000 mg | ORAL_CAPSULE | Freq: Every day | ORAL | 3 refills | Status: DC

## 2024-03-14 MED ORDER — PANTOPRAZOLE SODIUM 40 MG PO TBEC: 40.0000 mg | DELAYED_RELEASE_TABLET | Freq: Every day | ORAL | 2 refills | Status: DC

## 2024-03-14 MED ORDER — BREZTRI AEROSPHERE 160-9-4.8 MCG/ACT IN AERO: 2.0000 | INHALATION_SPRAY | Freq: Two times a day (BID) | RESPIRATORY_TRACT | 11 refills | Status: AC

## 2024-03-14 NOTE — Assessment & Plan Note (Signed)
 A1c 6.1 on labs from April 2024.  Repeat A1c ordered today.

## 2024-03-14 NOTE — Progress Notes (Signed)
 Established Patient Office Visit  Subjective   Patient ID: Harold Brady, male    DOB: Jan 12, 1951  Age: 73 y.o. MRN: [id number]  Chief Complaint  Patient presents with   Care Management    Six month follow up    Harold Brady returns here today for routine follow-up.  He was last evaluated by me in November 2024.  I recommended resuming Breztri  for maintenance therapy in the setting of COPD.  No additional medication changes were made and 4-month follow-up was arranged.  In the interim, he presented to urgent care on 2/18 endorsing URI symptoms.  There have otherwise been no acute interval events. Harold Brady reports feeling fairly well today.  He endorses chronic left shoulder pain with reduced range of motion, particularly overhead motions.  He is still able to play golf and is not limited in performing daily activities.  He states that he has been told previously that he has a frozen shoulder.  He additionally endorses a nonproductive cough when lying supine at night.  Past Medical History:  Diagnosis Date   HTN (hypertension)    Echocardiogram 1/23: EF 60-65, no RWMA, mild LVH, Gr 1 DD, normal RVSF, AV sclerosis w/o AS   Hyperlipemia    Mild emphysema (HCC)    Moderate 2v Coronary Artery Disease  10/01/2021   CAC score in 3/21: 1312 (93 rd percentile) // ETT 3/21: abnormal  // Cath 3/21: mod LAD and LCx disease - Med Rx    Pericardial effusion    Periodontal disease    Plantar fasciitis    Past Surgical History:  Procedure Laterality Date   BACK SURGERY     L4,L5   LEFT HEART CATH AND CORONARY ANGIOGRAPHY N/A 01/24/2020   Procedure: LEFT HEART CATH AND CORONARY ANGIOGRAPHY;  Surgeon: Arty Binning, MD;  Location: [facility];  Service: Cardiovascular;  Laterality: N/A;   Social History   Tobacco Use   Smoking status: Every Day    Current packs/day: 1.00    Average packs/day: 1 pack/day for 58.5 years (58.5 ttl pk-yrs)    Types: Cigarettes    Start date: 09/09/1965    Smokeless tobacco: Never  Vaping Use   Vaping status: Never Used  Substance Use Topics   Alcohol use: Not Currently    Comment: OCCASIONAL BEER   Drug use: Not Currently   Family History  Problem Relation Age of Onset   Coronary artery disease Mother    Cancer Mother        GALLBLADDER   Colon cancer Father    Hypertension Father    Lead poisoning Father        Asbestos   Hypertension Brother    Diabetes Maternal Grandfather    Heart failure Maternal Grandfather        Open heart surgery   Breast cancer Paternal Grandmother    Diabetes Other    Allergies  Allergen Reactions   Penicillins Hives and Other (See Comments)    Reaction as a child-unsure of what happens   Procaine Hives   Lipitor [Atorvastatin Calcium ] Other (See Comments)    MUSCLE ACHES, GI UPSET   Niaspan [Niacin Er (Antihyperlipidemic)] Itching and Other (See Comments)    FLUSHING   Review of Systems  Respiratory:  Positive for cough (when supine).   Musculoskeletal:  Positive for joint pain (left shoulder pain).     Objective:     BP 139/85   Pulse 72   Ht 6\' 2"  (  1.88 m)   Wt 209 lb 9.6 oz (95.1 kg)   SpO2 95%   BMI 26.91 kg/m  BP Readings from Last 3 Encounters:  03/14/24 139/85  12/15/23 (!) 160/92  09/11/23 (!) 162/90   Physical Exam Vitals reviewed.  Constitutional:      General: He is not in acute distress.    Appearance: Normal appearance. He is not ill-appearing.  HENT:     Head: Normocephalic and atraumatic.     Right Ear: External ear normal.     Left Ear: External ear normal.     Nose: Nose normal. No congestion or rhinorrhea.     Mouth/Throat:     Mouth: Mucous membranes are moist.     Pharynx: Oropharynx is clear.  Eyes:     General: No scleral icterus.    Extraocular Movements: Extraocular movements intact.     Conjunctiva/sclera: Conjunctivae normal.     Pupils: Pupils are equal, round, and reactive to light.  Cardiovascular:     Rate and Rhythm: Normal rate and  regular rhythm.     Pulses: Normal pulses.     Heart sounds: Normal heart sounds. No murmur heard. Pulmonary:     Effort: Pulmonary effort is normal.     Breath sounds: Normal breath sounds. No wheezing, rhonchi or rales.  Abdominal:     General: Abdomen is flat. Bowel sounds are normal. There is no distension.     Palpations: Abdomen is soft.     Tenderness: There is no abdominal tenderness.  Musculoskeletal:        General: No swelling or deformity.     Cervical back: Normal range of motion.     Comments: No obvious deformity on inspection of the left shoulder.  Active ROM is limited in general, particularly abduction beyond 90 degrees and external rotation.  Skin:    General: Skin is warm and dry.     Capillary Refill: Capillary refill takes less than 2 seconds.  Neurological:     General: No focal deficit present.     Mental Status: He is alert and oriented to person, place, and time.     Motor: No weakness.  Psychiatric:        Mood and Affect: Mood normal.        Behavior: Behavior normal.        Thought Content: Thought content normal.   Last CBC Lab Results  Component Value Date   WBC 10.4 02/16/2023   HGB 14.9 02/16/2023   HCT 45.2 02/16/2023   MCV 89 02/16/2023   MCH 29.4 02/16/2023   RDW 13.6 02/16/2023   PLT 252 02/16/2023   Last metabolic panel Lab Results  Component Value Date   GLUCOSE 97 02/16/2023   NA 141 02/16/2023   K 4.8 02/16/2023   CL 104 02/16/2023   CO2 23 02/16/2023   BUN 13 02/16/2023   CREATININE 1.04 02/16/2023   EGFR 78 02/16/2023   CALCIUM  9.1 02/16/2023   PROT 6.6 02/16/2023   ALBUMIN 4.1 02/16/2023   LABGLOB 2.5 02/16/2023   AGRATIO 1.6 02/16/2023   BILITOT 0.5 02/16/2023   ALKPHOS 84 02/16/2023   AST 19 02/16/2023   ALT 18 02/16/2023   Last lipids Lab Results  Component Value Date   CHOL 112 02/16/2023   HDL 38 (L) 02/16/2023   LDLCALC 57 02/16/2023   TRIG 87 02/16/2023   CHOLHDL 2.9 02/16/2023   Last hemoglobin  A1c Lab Results  Component Value Date   HGBA1C  6.1 (H) 02/16/2023   Last thyroid  functions Lab Results  Component Value Date   TSH 0.578 02/16/2023   Last vitamin D  Lab Results  Component Value Date   VD25OH 29.0 (L) 02/16/2023   Last vitamin B12 and Folate Lab Results  Component Value Date   VITAMINB12 327 02/16/2023   FOLATE 17.3 02/16/2023     Assessment & Plan:   Problem List Items Addressed This Visit       HTN (hypertension) - Primary   Remains adequately controlled on current antihypertensive regimen.  No medication changes are indicated today.      COPD (chronic obstructive pulmonary disease) (HCC)   Asymptomatic currently.  Pulmonary exam is unremarkable.  He uses albuterol  infrequently.  We previously discussed resuming Breztri  as maintenance but he has not done so.  This was further discussed today and he is in agreement with resuming Breztri .  New prescription sent today.  He was again strongly encouraged to abstain from tobacco use.      GERD (gastroesophageal reflux disease)   His additional concern today is a nonproductive cough while lying supine.  He denies orthopnea/PND.  He endorses a history of reflux.  Symptoms are typically alleviated with Tums.  I am concerned that his cough may be related to reflux.  Will trial Protonix  40 mg daily.      Nightmares associated with chronic post-traumatic stress disorder   Prazosin  has previously been effective.  He is out of medication currently.  Refill provided today.      Osteoarthritis of left glenohumeral joint   His acute concern today is chronic left shoulder pain.  He has a documented history of osteoarthritis of the left glenohumeral joint.  ROM is limited in general, particularly abduction and external rotation.  Treatment options reviewed.  Offered to place referral to orthopedic surgery and physical therapy but he has declined for now.  He will notify our office if pain worsens and he wants to see  orthopedic surgery.      Prediabetes   A1c 6.1 on labs from April 2024.  Repeat A1c ordered today.      Return in about 6 months (around 09/14/2024).   Tobi Fortes, MD

## 2024-03-14 NOTE — Assessment & Plan Note (Signed)
 Prazosin  has previously been effective.  He is out of medication currently.  Refill provided today.

## 2024-03-14 NOTE — Patient Instructions (Signed)
 It was a pleasure to see you today.  Thank you for giving us  the opportunity to be involved in your care.  Below is a brief recap of your visit and next steps.  We will plan to see you again in 6 months.  Summary Resume prazosin  for nightmares Breztri  for COPD maintenance Add protonix  for reflux symptoms Follow up in 6 months

## 2024-03-14 NOTE — Assessment & Plan Note (Signed)
 His acute concern today is chronic left shoulder pain.  He has a documented history of osteoarthritis of the left glenohumeral joint.  ROM is limited in general, particularly abduction and external rotation.  Treatment options reviewed.  Offered to place referral to orthopedic surgery and physical therapy but he has declined for now.  He will notify our office if pain worsens and he wants to see orthopedic surgery.

## 2024-03-14 NOTE — Assessment & Plan Note (Signed)
 Asymptomatic currently.  Pulmonary exam is unremarkable.  He uses albuterol  infrequently.  We previously discussed resuming Breztri  as maintenance but he has not done so.  This was further discussed today and he is in agreement with resuming Breztri .  New prescription sent today.  He was again strongly encouraged to abstain from tobacco use.

## 2024-03-14 NOTE — Assessment & Plan Note (Signed)
 His additional concern today is a nonproductive cough while lying supine.  He denies orthopnea/PND.  He endorses a history of reflux.  Symptoms are typically alleviated with Tums.  I am concerned that his cough may be related to reflux.  Will trial Protonix  40 mg daily.

## 2024-03-14 NOTE — Assessment & Plan Note (Signed)
 Remains adequately controlled on current antihypertensive regimen.  No medication changes are indicated today.

## 2024-03-15 ENCOUNTER — Ambulatory Visit: Payer: Self-pay | Admitting: Internal Medicine

## 2024-03-15 ENCOUNTER — Other Ambulatory Visit: Payer: Self-pay | Admitting: Internal Medicine

## 2024-03-15 LAB — CBC WITH DIFFERENTIAL/PLATELET
Basophils Absolute: 0 10*3/uL (ref 0.0–0.2)
Basos: 0 %
EOS (ABSOLUTE): 0.1 10*3/uL (ref 0.0–0.4)
Eos: 1 %
Hematocrit: 48.4 % (ref 37.5–51.0)
Hemoglobin: 15.3 g/dL (ref 13.0–17.7)
Immature Grans (Abs): 0 10*3/uL (ref 0.0–0.1)
Immature Granulocytes: 0 %
Lymphocytes Absolute: 1.8 10*3/uL (ref 0.7–3.1)
Lymphs: 25 %
MCH: 29.6 pg (ref 26.6–33.0)
MCHC: 31.6 g/dL (ref 31.5–35.7)
MCV: 94 fL (ref 79–97)
Monocytes Absolute: 0.9 10*3/uL (ref 0.1–0.9)
Monocytes: 12 %
Neutrophils Absolute: 4.6 10*3/uL (ref 1.4–7.0)
Neutrophils: 62 %
Platelets: 206 10*3/uL (ref 150–450)
RBC: 5.17 x10E6/uL (ref 4.14–5.80)
RDW: 13 % (ref 11.6–15.4)
WBC: 7.5 10*3/uL (ref 3.4–10.8)

## 2024-03-15 LAB — HEMOGLOBIN A1C
Est. average glucose Bld gHb Est-mCnc: 117 mg/dL
Hgb A1c MFr Bld: 5.7 % — ABNORMAL HIGH (ref 4.8–5.6)

## 2024-03-15 LAB — CMP14+EGFR
ALT: 21 IU/L (ref 0–44)
AST: 25 IU/L (ref 0–40)
Albumin: 4.6 g/dL (ref 3.9–4.9)
Alkaline Phosphatase: 92 IU/L (ref 44–121)
BUN/Creatinine Ratio: 16 (ref 10–24)
BUN: 16 mg/dL (ref 8–27)
Bilirubin Total: 0.5 mg/dL (ref 0.0–1.2)
CO2: 20 mmol/L (ref 20–29)
Calcium: 9.3 mg/dL (ref 8.6–10.2)
Chloride: 105 mmol/L (ref 96–106)
Creatinine, Ser: 0.98 mg/dL (ref 0.76–1.27)
Globulin, Total: 2.1 g/dL (ref 1.5–4.5)
Glucose: 101 mg/dL — ABNORMAL HIGH (ref 70–99)
Potassium: 4.5 mmol/L (ref 3.5–5.2)
Sodium: 141 mmol/L (ref 134–144)
Total Protein: 6.7 g/dL (ref 6.0–8.5)
eGFR: 83 mL/min/{1.73_m2} (ref >59)

## 2024-03-15 LAB — B12 AND FOLATE PANEL
Folate: 12.6 ng/mL (ref >3.0)
Vitamin B-12: 322 pg/mL (ref 232–1245)

## 2024-03-15 LAB — PSA: Prostate Specific Ag, Serum: 2.1 ng/mL (ref 0.0–4.0)

## 2024-03-15 LAB — VITAMIN D 25 HYDROXY (VIT D DEFICIENCY, FRACTURES): Vit D, 25-Hydroxy: 40.8 ng/mL (ref 30.0–100.0)

## 2024-03-15 LAB — LIPID PANEL
Chol/HDL Ratio: 3.1 ratio (ref 0.0–5.0)
Cholesterol, Total: 107 mg/dL (ref 100–199)
HDL: 35 mg/dL — ABNORMAL LOW (ref >39)
LDL Chol Calc (NIH): 55 mg/dL (ref 0–99)
Triglycerides: 83 mg/dL (ref 0–149)
VLDL Cholesterol Cal: 17 mg/dL (ref 5–40)

## 2024-03-15 LAB — TSH+FREE T4
Free T4: 1.13 ng/dL (ref 0.82–1.77)
TSH: 0.785 u[IU]/mL (ref 0.450–4.500)

## 2024-04-18 ENCOUNTER — Other Ambulatory Visit: Payer: Self-pay | Admitting: Internal Medicine

## 2024-05-09 ENCOUNTER — Other Ambulatory Visit: Payer: Self-pay

## 2024-05-10 ENCOUNTER — Telehealth: Payer: Self-pay

## 2024-05-10 NOTE — Telephone Encounter (Signed)
 Copied from CRM [id number]. Topic: Clinical - Medication Question >> May 10, 2024 12:57 PM Tobias CROME wrote: Reason for CRM: Robin with [facility] pharmacy requesting current med list for patient.   Requesting medication list be faxed:  [phone]

## 2024-05-10 NOTE — Telephone Encounter (Signed)
 Ready to fax

## 2024-05-11 NOTE — Telephone Encounter (Signed)
 Faxed

## 2024-05-12 ENCOUNTER — Telehealth: Payer: Self-pay

## 2024-05-12 ENCOUNTER — Other Ambulatory Visit: Payer: Self-pay | Admitting: Internal Medicine

## 2024-05-12 NOTE — Telephone Encounter (Signed)
 re-faxed

## 2024-05-12 NOTE — Telephone Encounter (Signed)
 Copied from CRM [id number]. Topic: Clinical - Medication Question >> May 10, 2024 12:57 PM Tobias CROME wrote: Reason for CRM: Robin with [facility] pharmacy requesting current med list for patient.   Requesting medication list be faxed:  [phone] >> May 12, 2024  9:45 AM DeAngela L wrote: Grayce calling with [facility] calling to ask if the office faxed current med list for pt, informed her the note states yes the office did on 05/11/24 and she states she did  not receive and ask if the office could fax another list today  Phone num [phone] Fax num [phone]

## 2024-05-26 DIAGNOSIS — G5621 Lesion of ulnar nerve, right upper limb: Secondary | ICD-10-CM | POA: Diagnosis not present

## 2024-05-26 DIAGNOSIS — G5622 Lesion of ulnar nerve, left upper limb: Secondary | ICD-10-CM | POA: Diagnosis not present

## 2024-05-26 DIAGNOSIS — G5623 Lesion of ulnar nerve, bilateral upper limbs: Secondary | ICD-10-CM | POA: Diagnosis not present

## 2024-05-26 DIAGNOSIS — M19012 Primary osteoarthritis, left shoulder: Secondary | ICD-10-CM | POA: Diagnosis not present

## 2024-05-26 DIAGNOSIS — M65332 Trigger finger, left middle finger: Secondary | ICD-10-CM | POA: Diagnosis not present

## 2024-05-29 ENCOUNTER — Other Ambulatory Visit: Payer: Self-pay | Admitting: Internal Medicine

## 2024-05-29 DIAGNOSIS — K219 Gastro-esophageal reflux disease without esophagitis: Secondary | ICD-10-CM

## 2024-06-08 ENCOUNTER — Other Ambulatory Visit: Payer: Self-pay | Admitting: Cardiovascular Disease

## 2024-06-08 ENCOUNTER — Other Ambulatory Visit: Payer: Self-pay

## 2024-06-17 ENCOUNTER — Telehealth: Payer: Self-pay

## 2024-06-17 NOTE — Progress Notes (Signed)
 Pharmacy Quality Measure Review  This patient is appearing on a report for being at risk of failing the adherence measure for hypertension (ACEi/ARB) medications this calendar year.   Medication: olmesartan 40 mg daily Last fill date: 05/12/24 for 30 day supply  Left voicemail for patient to return my call at their convenience.  Woodie Jock, PharmD PGY1 Pharmacy Resident

## 2024-06-28 ENCOUNTER — Other Ambulatory Visit: Payer: Self-pay

## 2024-06-28 ENCOUNTER — Other Ambulatory Visit: Payer: Self-pay | Admitting: Cardiovascular Disease

## 2024-07-11 ENCOUNTER — Other Ambulatory Visit: Payer: Self-pay

## 2024-07-13 ENCOUNTER — Other Ambulatory Visit: Payer: Self-pay | Admitting: Internal Medicine

## 2024-07-14 ENCOUNTER — Telehealth: Payer: Self-pay | Admitting: Pharmacy Technician

## 2024-07-14 ENCOUNTER — Other Ambulatory Visit: Payer: Self-pay

## 2024-07-14 ENCOUNTER — Other Ambulatory Visit (HOSPITAL_COMMUNITY): Payer: Self-pay

## 2024-07-14 NOTE — Telephone Encounter (Signed)
 Pharmacy Patient Advocate Encounter   Received notification from Onbase that prior authorization for Icosapent  Ethyl 1 Gram Capsules is required/requested.   Insurance verification completed.   The patient is insured through [employer] .   Per test claim: Refill too soon. PA is not needed at this time. Medication was filled 07/13/2024. Next eligible fill date is 08/05/2024.

## 2024-07-21 ENCOUNTER — Other Ambulatory Visit: Payer: Self-pay | Admitting: Cardiovascular Disease

## 2024-08-11 DIAGNOSIS — M65332 Trigger finger, left middle finger: Secondary | ICD-10-CM | POA: Diagnosis not present

## 2024-08-11 DIAGNOSIS — M19012 Primary osteoarthritis, left shoulder: Secondary | ICD-10-CM | POA: Diagnosis not present

## 2024-08-11 DIAGNOSIS — G5603 Carpal tunnel syndrome, bilateral upper limbs: Secondary | ICD-10-CM | POA: Diagnosis not present

## 2024-08-11 DIAGNOSIS — G5623 Lesion of ulnar nerve, bilateral upper limbs: Secondary | ICD-10-CM | POA: Diagnosis not present

## 2024-08-12 ENCOUNTER — Other Ambulatory Visit: Payer: Self-pay

## 2024-08-25 ENCOUNTER — Ambulatory Visit: Payer: Self-pay | Admitting: *Deleted

## 2024-08-25 NOTE — Telephone Encounter (Signed)
 FYI Only or Action Required?: Patient refused alternate location- UC. Patient has been scheduled for first available appointment- with instruction if increased/change in symptoms- UC  Patient was last seen in primary care on 03/14/2024 by Melvenia Manus BRAVO, MD.  Called Nurse Triage reporting Fatigue.  Symptoms began several days ago.  Interventions attempted: Nothing.  Symptoms are: gradually worsening.  Triage Disposition: See HCP Within 4 Hours (Or PCP Triage)  Patient/caregiver understands and will follow disposition?: No, refuses disposition  Copied from CRM [id number]. Topic: Clinical - Red Word Triage >> Aug 25, 2024 10:45 AM Leonette SQUIBB wrote: Red Word that prompted transfer to Nurse Triage: Pt's wife called saying her husband is having extreme fatigue.  He went out of town and came back three days ago and almost sleeping the whole 3 days since he has returned stating he just don't feel right Reason for Disposition  [1] MODERATE weakness (e.g., interferes with work, school, normal activities) AND [2] cause unknown  (Exceptions: Weakness from acute minor illness or poor fluid intake; weakness is chronic and not worse.)  Answer Assessment - Initial Assessment Questions 1. DESCRIPTION: Describe how you are feeling.     Patient feels fatigued- if he sits- he sleeps, no appetite   2. SEVERITY: How bad is it?  Can you stand and walk?     Sleeping more- just getting up to eat 3. ONSET: When did these symptoms begin? (e.g., hours, days, weeks, months)     More sleeping within the past 3 days 4. CAUSE: What do you think is causing the weakness or fatigue? (e.g., not drinking enough fluids, medical problem, trouble sleeping)     unsure 5. NEW MEDICINES:  Have you started on any new medicines recently? (e.g., opioid pain medicines, benzodiazepines, muscle relaxants, antidepressants, antihistamines, neuroleptics, beta blockers)     no 6. OTHER SYMPTOMS: Do you have any other  symptoms? (e.g., chest pain, fever, cough, SOB, vomiting, diarrhea, bleeding, other areas of pain)     Numbness, tingling R hand  Protocols used: Weakness (Generalized) and Fatigue-A-AH

## 2024-08-26 NOTE — Telephone Encounter (Signed)
 Okay to schedule with me, or first available if he prefers.

## 2024-08-29 ENCOUNTER — Ambulatory Visit: Payer: Self-pay

## 2024-08-29 ENCOUNTER — Other Ambulatory Visit: Payer: Self-pay

## 2024-08-29 VITALS — BP 114/70 | HR 77 | Ht 74.0 in | Wt 208.0 lb

## 2024-08-29 DIAGNOSIS — Z720 Tobacco use: Secondary | ICD-10-CM

## 2024-08-29 DIAGNOSIS — R7303 Prediabetes: Secondary | ICD-10-CM | POA: Diagnosis not present

## 2024-08-29 DIAGNOSIS — F4312 Post-traumatic stress disorder, chronic: Secondary | ICD-10-CM

## 2024-08-29 DIAGNOSIS — E782 Mixed hyperlipidemia: Secondary | ICD-10-CM | POA: Diagnosis not present

## 2024-08-29 DIAGNOSIS — K219 Gastro-esophageal reflux disease without esophagitis: Secondary | ICD-10-CM

## 2024-08-29 DIAGNOSIS — Z122 Encounter for screening for malignant neoplasm of respiratory organs: Secondary | ICD-10-CM

## 2024-08-29 DIAGNOSIS — I10 Essential (primary) hypertension: Secondary | ICD-10-CM

## 2024-08-29 DIAGNOSIS — I2510 Atherosclerotic heart disease of native coronary artery without angina pectoris: Secondary | ICD-10-CM | POA: Diagnosis not present

## 2024-08-29 DIAGNOSIS — F515 Nightmare disorder: Secondary | ICD-10-CM

## 2024-08-29 MED ORDER — PRAZOSIN HCL 2 MG PO CAPS: 4.0000 mg | ORAL_CAPSULE | Freq: Every day | ORAL | 11 refills | Status: AC

## 2024-08-29 NOTE — Progress Notes (Signed)
 Established Patient Office Visit  Subjective   Patient ID: Harold Brady, male    DOB: 1951/04/20  Age: 73 y.o. MRN: [id number]  Chief Complaint  Patient presents with   Medical Management of Chronic Issues    Pt states been having increased fatigue     HPI  Patient Active Problem List   Diagnosis Date Noted   GERD (gastroesophageal reflux disease) 03/14/2024   Prediabetes 09/11/2023   Nightmares associated with chronic post-traumatic stress disorder 03/18/2023   COPD (chronic obstructive pulmonary disease) (HCC) 03/18/2023   Screening for malignant neoplasm of colon 03/15/2023   Constipation 03/15/2023   Family history of colon cancer 03/15/2023   History of colonic polyps 03/15/2023   Flatulence, eructation and gas pain 03/15/2023   Left lower quadrant pain 03/15/2023   Osteoarthritis of left glenohumeral joint 12/16/2022   Pain in joint of left shoulder 12/12/2022   Pain in joint of right knee 12/09/2022   Restless leg syndrome 06/18/2022   Preventative health care 06/18/2022   Shortness of breath 10/02/2021   Moderate 2v Coronary Artery Disease  10/01/2021   Abnormal stress test    Tobacco abuse 08/22/2015   Edema 08/22/2015   SOB (shortness of breath) 08/22/2015   Chronic sinusitis 08/26/2011   Pericardial effusion    HTN (hypertension)    Hyperlipemia       ROS    Objective:     BP 114/70 (BP Location: Right Arm, Patient Position: Sitting, Cuff Size: Normal)   Pulse 77   Ht 6' 2 (1.88 m)   Wt 208 lb (94.3 kg)   SpO2 95%   BMI 26.71 kg/m  BP Readings from Last 3 Encounters:  08/29/24 114/70  03/14/24 139/85  12/15/23 (!) 160/92   Wt Readings from Last 3 Encounters:  08/29/24 208 lb (94.3 kg)  03/14/24 209 lb 9.6 oz (95.1 kg)  09/11/23 214 lb (97.1 kg)     Physical Exam Vitals and nursing note reviewed.  Constitutional:      Appearance: Normal appearance.  HENT:     Head: Normocephalic.     Right Ear: Tympanic membrane, ear canal and  external ear normal.     Left Ear: Tympanic membrane, ear canal and external ear normal.     Nose: Nose normal.     Mouth/Throat:     Mouth: Mucous membranes are moist.     Pharynx: Oropharynx is clear.  Cardiovascular:     Rate and Rhythm: Normal rate and regular rhythm.  Pulmonary:     Effort: Pulmonary effort is normal.     Breath sounds: Normal breath sounds.  Musculoskeletal:     Cervical back: Normal range of motion and neck supple.  Skin:    General: Skin is warm and dry.  Neurological:     Mental Status: He is alert and oriented to person, place, and time.  Psychiatric:        Mood and Affect: Mood normal.        Thought Content: Thought content normal.        The ASCVD Risk score (Arnett DK, et al., 2019) failed to calculate for the following reasons:   The valid total cholesterol range is 130 to 320 mg/dL    Assessment & Plan:   Problem List Items Addressed This Visit       Cardiovascular and Mediastinum   HTN (hypertension) - Primary   Remains adequately controlled on current antihypertensive regimen.  No medication changes are indicated today.  Relevant Medications   prazosin  (MINIPRESS ) 2 MG capsule   Other Relevant Orders   B12 and Folate Panel (Completed)   VITAMIN D  25 Hydroxy (Vit-D Deficiency, Fractures) (Completed)   TSH + free T4 (Completed)   Hemoglobin A1c (Completed)   Lipid panel (Completed)   CMP14+EGFR (Completed)   CBC with Differential/Platelet (Completed)   Moderate 2v Coronary Artery Disease    He denies experiencing any recent chest pain or dyspnea on exertion.  He is currently prescribed carvedilol , olmesartan, rosuvastatin , ezetimibe , and Vascepa .  He is closely followed by cardiology. -No changes today      Relevant Medications   prazosin  (MINIPRESS ) 2 MG capsule   Other Relevant Orders   B12 and Folate Panel (Completed)   VITAMIN D  25 Hydroxy (Vit-D Deficiency, Fractures) (Completed)   TSH + free T4 (Completed)    Hemoglobin A1c (Completed)   Lipid panel (Completed)   CMP14+EGFR (Completed)   CBC with Differential/Platelet (Completed)     Nervous and Auditory   Nightmares associated with chronic post-traumatic stress disorder   Prazosin  increased to 2 mg based on reports of continued symptoms.  Refill provided today.      Relevant Medications   prazosin  (MINIPRESS ) 2 MG capsule     Other   Hyperlipemia   Currently managed with regimen of Vascepa , rosuvastatin , and ezetimibe . Update lipid panel today.        Relevant Medications   prazosin  (MINIPRESS ) 2 MG capsule   Other Relevant Orders   Lipid panel (Completed)   Tobacco abuse   Update CT lung cancer screening.       Relevant Orders   CT CHEST LUNG CA SCREEN LOW DOSE W/O CM   Prediabetes   Update A1c.         Relevant Orders   Hemoglobin A1c (Completed)   Other Visit Diagnoses       Screening for lung cancer       Relevant Orders   CT CHEST LUNG CA SCREEN LOW DOSE W/O CM       No follow-ups on file.    Leita Longs, FNP

## 2024-08-29 NOTE — Telephone Encounter (Signed)
 noted

## 2024-08-30 LAB — VITAMIN D 25 HYDROXY (VIT D DEFICIENCY, FRACTURES): Vit D, 25-Hydroxy: 38 ng/mL (ref 30.0–100.0)

## 2024-08-30 LAB — CMP14+EGFR
ALT: 16 IU/L (ref 0–44)
AST: 18 IU/L (ref 0–40)
Albumin: 4.2 g/dL (ref 3.9–4.9)
Alkaline Phosphatase: 87 IU/L (ref 47–123)
BUN/Creatinine Ratio: 13 (ref 10–24)
BUN: 12 mg/dL (ref 8–27)
Bilirubin Total: 0.4 mg/dL (ref 0.0–1.2)
CO2: 24 mmol/L (ref 20–29)
Calcium: 9.1 mg/dL (ref 8.6–10.2)
Chloride: 105 mmol/L (ref 96–106)
Creatinine, Ser: 0.9 mg/dL (ref 0.76–1.27)
Globulin, Total: 2 g/dL (ref 1.5–4.5)
Glucose: 95 mg/dL (ref 70–99)
Potassium: 4.2 mmol/L (ref 3.5–5.2)
Sodium: 143 mmol/L (ref 134–144)
Total Protein: 6.2 g/dL (ref 6.0–8.5)
eGFR: 92 mL/min/1.73 (ref >59)

## 2024-08-30 LAB — CBC WITH DIFFERENTIAL/PLATELET
Basophils Absolute: 0 x10E3/uL (ref 0.0–0.2)
Basos: 0 %
EOS (ABSOLUTE): 0.1 x10E3/uL (ref 0.0–0.4)
Eos: 1 %
Hematocrit: 47.7 % (ref 37.5–51.0)
Hemoglobin: 15.1 g/dL (ref 13.0–17.7)
Immature Grans (Abs): 0 x10E3/uL (ref 0.0–0.1)
Immature Granulocytes: 0 %
Lymphocytes Absolute: 1.6 x10E3/uL (ref 0.7–3.1)
Lymphs: 20 %
MCH: 29.2 pg (ref 26.6–33.0)
MCHC: 31.7 g/dL (ref 31.5–35.7)
MCV: 92 fL (ref 79–97)
Monocytes Absolute: 0.8 x10E3/uL (ref 0.1–0.9)
Monocytes: 9 %
Neutrophils Absolute: 5.7 x10E3/uL (ref 1.4–7.0)
Neutrophils: 70 %
Platelets: 226 x10E3/uL (ref 150–450)
RBC: 5.18 x10E6/uL (ref 4.14–5.80)
RDW: 12.6 % (ref 11.6–15.4)
WBC: 8.2 x10E3/uL (ref 3.4–10.8)

## 2024-08-30 LAB — HEMOGLOBIN A1C
Est. average glucose Bld gHb Est-mCnc: 117 mg/dL
Hgb A1c MFr Bld: 5.7 % — ABNORMAL HIGH (ref 4.8–5.6)

## 2024-08-30 LAB — B12 AND FOLATE PANEL
Folate: 15.5 ng/mL (ref >3.0)
Vitamin B-12: 293 pg/mL (ref 232–1245)

## 2024-08-30 LAB — LIPID PANEL
Chol/HDL Ratio: 3.2 ratio (ref 0.0–5.0)
Cholesterol, Total: 109 mg/dL (ref 100–199)
HDL: 34 mg/dL — ABNORMAL LOW (ref >39)
LDL Chol Calc (NIH): 53 mg/dL (ref 0–99)
Triglycerides: 121 mg/dL (ref 0–149)
VLDL Cholesterol Cal: 22 mg/dL (ref 5–40)

## 2024-08-30 LAB — TSH+FREE T4
Free T4: 1.12 ng/dL (ref 0.82–1.77)
TSH: 0.65 u[IU]/mL (ref 0.450–4.500)

## 2024-09-04 ENCOUNTER — Ambulatory Visit: Payer: Self-pay

## 2024-09-04 NOTE — Assessment & Plan Note (Signed)
 He denies experiencing any recent chest pain or dyspnea on exertion.  He is currently prescribed carvedilol , olmesartan, rosuvastatin , ezetimibe , and Vascepa .  He is closely followed by cardiology. -No changes today

## 2024-09-04 NOTE — Assessment & Plan Note (Signed)
 Update CT lung cancer screening.

## 2024-09-04 NOTE — Assessment & Plan Note (Signed)
 Update A1c ?

## 2024-09-04 NOTE — Assessment & Plan Note (Signed)
 Prazosin  increased to 2 mg based on reports of continued symptoms.  Refill provided today.

## 2024-09-04 NOTE — Assessment & Plan Note (Signed)
 Currently managed with regimen of Vascepa , rosuvastatin , and ezetimibe . Update lipid panel today.

## 2024-09-04 NOTE — Assessment & Plan Note (Signed)
 Remains adequately controlled on current antihypertensive regimen.  No medication changes are indicated today.

## 2024-09-05 ENCOUNTER — Other Ambulatory Visit: Payer: Self-pay

## 2024-09-05 DIAGNOSIS — Z122 Encounter for screening for malignant neoplasm of respiratory organs: Secondary | ICD-10-CM

## 2024-09-06 ENCOUNTER — Other Ambulatory Visit: Payer: Self-pay

## 2024-09-06 ENCOUNTER — Other Ambulatory Visit: Payer: Self-pay | Admitting: Cardiovascular Disease

## 2024-09-06 DIAGNOSIS — Z87891 Personal history of nicotine dependence: Secondary | ICD-10-CM

## 2024-09-06 DIAGNOSIS — F17210 Nicotine dependence, cigarettes, uncomplicated: Secondary | ICD-10-CM

## 2024-09-06 DIAGNOSIS — Z122 Encounter for screening for malignant neoplasm of respiratory organs: Secondary | ICD-10-CM

## 2024-09-12 ENCOUNTER — Other Ambulatory Visit: Payer: Self-pay

## 2024-09-19 ENCOUNTER — Ambulatory Visit

## 2024-09-23 ENCOUNTER — Other Ambulatory Visit: Payer: Self-pay

## 2024-10-07 ENCOUNTER — Other Ambulatory Visit: Payer: Self-pay

## 2024-10-07 DIAGNOSIS — K219 Gastro-esophageal reflux disease without esophagitis: Secondary | ICD-10-CM

## 2024-11-01 ENCOUNTER — Other Ambulatory Visit: Payer: Self-pay | Admitting: Internal Medicine

## 2024-11-01 NOTE — Progress Notes (Signed)
 " Cardiology Office Note:    Date:  11/04/2024   ID:  Harold Brady, DOB 01-18-1951, MRN [id number]  PCP:  Bevely Doffing, FNP   [facility] Providers Cardiologist:  Maude Emmer, MD     Referring MD: Bevely Doffing, FNP    History of Present Illness:    Harold Brady is a very pleasant 74 y.o. male with a hx of   Coronary artery disease  CAC score in 3/21: 1312 (93 rd percentile) ETT 3/21: abnormal Cath 3/21: mod LAD and LCx disease - Med Rx  Hypertension  Hyperlipidemia  +Cigs  Venous insufficiency  ABIs in 11/19: normal  He was seen in our office on 10/02/2021 by Glendia Ferrier, PA.  He reported recent shortness of breath, no chest pain.  Echocardiogram was ordered for evaluation and revealed normal LVEF 60 to 65%, no RWMA, mild LVH, G1 DD, mild calcification of the aortic valve with no evidence of stenosis.  Lipid panel in March 2023 revealed LDL 110, above goal of 70 and he was asked to increase rosuvastatin  to 40 mg daily and continue ezetimibe  10 mg daily.    Seen by PA 03/14/22 for LLE edema after [place] trip [place]  negative for DVT just chronic venous insufficieny  Bad dreams and acts out at night with some physicality with wife On Requip and Citalopram improved when taking  COVID positive 09/2022  Seen in ED May 2024 for bronchitisCOPD and had course of steroids and antibiotics Still smoking a ppd  Discussed need to see ENT for voice change Make sure no polyps or other issues Most likely bronchitic Discussed using 21 mg nicotine  patch CT 05/31/23 with left lung scarring and emphysema no cancer   No angina mild exertional dyspnea He says he hasn't smoked in a year but wife disagrees Seems to be sleeping more and wife describes apnea at night    Past Medical History:  Diagnosis Date   HTN (hypertension)    Echocardiogram 1/23: EF 60-65, no RWMA, mild LVH, Gr 1 DD, normal RVSF, AV sclerosis w/o AS   Hyperlipemia    Mild emphysema (HCC)    Moderate 2v  Coronary Artery Disease  10/01/2021   CAC score in 3/21: 1312 (93 rd percentile) // ETT 3/21: abnormal  // Cath 3/21: mod LAD and LCx disease - Med Rx    Pericardial effusion    Periodontal disease    Plantar fasciitis     Past Surgical History:  Procedure Laterality Date   BACK SURGERY     L4,L5   LEFT HEART CATH AND CORONARY ANGIOGRAPHY N/A 01/24/2020   Procedure: LEFT HEART CATH AND CORONARY ANGIOGRAPHY;  Surgeon: Claudene Victory ORN, MD;  Location: [facility];  Service: Cardiovascular;  Laterality: N/A;    Current Medications: Current Meds  Medication Sig   Acetaminophen  (TYLENOL  EXTRA STRENGTH PO) Take 500 mg by mouth as needed.   albuterol  (VENTOLIN  HFA) 108 (90 Base) MCG/ACT inhaler Inhale 2 puffs into the lungs every 4 (four) hours as needed for wheezing or shortness of breath.   budesonide-glycopyrrolate-formoterol (BREZTRI  AEROSPHERE) 160-9-4.8 MCG/ACT AERO inhaler Inhale 2 puffs into the lungs 2 (two) times daily.   carvedilol  (COREG ) 3.125 MG tablet Take 1 tablet (3.125 mg total) by mouth 2 (two) times daily.   citalopram (CELEXA) 20 MG tablet TAKE (1) TABLET BY MOUTH AT BEDTIME.   diclofenac (VOLTAREN) 75 MG EC tablet TAKE (1) TABLET BY MOUTH TWICE DAILY.   ezetimibe  (ZETIA ) 10 MG  tablet TAKE ONE TABLET BY MOUTH AT BEDTIME.   Multiple Vitamin (MULTIVITAMIN WITH MINERALS) TABS tablet Take 1 tablet by mouth 2 (two) times a week.   nitroGLYCERIN  (NITROSTAT ) 0.4 MG SL tablet Place 1 tablet (0.4 mg total) under the tongue every 5 (five) minutes as needed for chest pain. Up to 3 tablets 5 minutes apart.   olmesartan (BENICAR) 40 MG tablet TAKE ONE TABLET BY MOUTH EVERYDAY AT BEDTIME   pantoprazole  (PROTONIX ) 40 MG tablet Take 1 tablet (40 mg total) by mouth daily.   prazosin  (MINIPRESS ) 2 MG capsule Take 2 capsules (4 mg total) by mouth at bedtime.   rOPINIRole (REQUIP) 0.25 MG tablet TAKE ONE TO TWO TABLETS BY MOUTH ONE TO THREE HOURS BEFORE BED.   rosuvastatin  (CRESTOR ) 40  MG tablet Take 1 tablet (40 mg total) by mouth daily.   VASCEPA  1 g capsule TAKE 2 CAPSULES BY MOUTH TWICE DAILY.     Allergies:   Penicillins, Procaine, Lipitor [atorvastatin calcium ], and Niaspan [niacin er (antihyperlipidemic)]   Social History   Socioeconomic History   Marital status: Married    Spouse name: Not on file   Number of children: Not on file   Years of education: Not on file   Highest education level: Not on file  Occupational History   Not on file  Tobacco Use   Smoking status: Every Day    Current packs/day: 1.00    Average packs/day: 1 pack/day for 59.2 years (59.2 ttl pk-yrs)    Types: Cigarettes    Start date: 09/09/1965   Smokeless tobacco: Never  Vaping Use   Vaping status: Never Used  Substance and Sexual Activity   Alcohol use: Not Currently    Comment: OCCASIONAL BEER   Drug use: Not Currently   Sexual activity: Yes  Other Topics Concern   Not on file  Social History Narrative   Not on file   Social Drivers of Health   Tobacco Use: High Risk (11/04/2024)   Patient History    Smoking Tobacco Use: Every Day    Smokeless Tobacco Use: Never    Passive Exposure: Not on file  Financial Resource Strain: Not on file  Food Insecurity: Not on file  Transportation Needs: Not on file  Physical Activity: Not on file  Stress: Not on file  Social Connections: Unknown (03/09/2022)   Received from [facility]   Social Network    Social Network: Not on file  Depression (PHQ2-9): Low Risk (08/29/2024)   Depression (PHQ2-9)    PHQ-2 Score: 0  Alcohol Screen: Not on file  Housing: Not on file  Utilities: Not on file  Health Literacy: Not on file     Family History: The patient's family history includes Breast cancer in his paternal grandmother; Cancer in his mother; Colon cancer in his father; Coronary artery disease in his mother; Diabetes in his maternal grandfather and another family member; Heart failure in his maternal grandfather; Hypertension in  his brother and father; Lead poisoning in his father.  ROS:   Please see the history of present illness.    +LLE edema +burning in bilateral legs All other systems reviewed and are negative.  Labs/Other Studies Reviewed:    The following studies were reviewed today:  Echo 11/05/21  1. Left ventricular ejection fraction, by estimation, is 60 to 65%. The  left ventricle has normal function. The left ventricle has no regional  wall motion abnormalities. There is mild left ventricular hypertrophy.  Left ventricular diastolic parameters  are consistent with Grade I diastolic dysfunction (impaired relaxation).   2. Right ventricular systolic function is normal. The right ventricular  size is normal.   3. The mitral valve is normal in structure. No evidence of mitral valve  regurgitation. No evidence of mitral stenosis.   4. The aortic valve is tricuspid. There is mild calcification of the  aortic valve. There is mild thickening of the aortic valve. Aortic valve  regurgitation is not visualized. Aortic valve sclerosis is present, with  no evidence of aortic valve stenosis.   5. The inferior vena cava is normal in size with greater than 50%  respiratory variability, suggesting right atrial pressure of 3 mmHg.    LHC 01/24/20  Cardiac catheterization 01/24/20 LAD prox 65, mid 70 LCx ost 40, prox 50, mid 75 RCA mid 25 EF 55-65    ETT 01/17/20  Blood pressure demonstrated a hypertensive response to exercise. Upsloping ST segment depression ST segment depression of 1 mm was noted during stress in the V5 and V6 leads.   Positive ETT with 1 mm upsloping ST depression in recovery Severely HTN response to exercise Patient only achieved 79% PMHR with stress Couplets / PVCls with stress and recovery    CT Cardiac Scoring 01/17/20  Coronary calcium  score of 1312. This was 30 rd percentile for age and sex matched control.   Given how high score is would consider f/u perfusion  imaging  Recent Labs: 08/29/2024: ALT 16; BUN 12; Creatinine, Ser 0.90; Hemoglobin 15.1; Platelets 226; Potassium 4.2; Sodium 143; TSH 0.650  Recent Lipid Panel    Component Value Date/Time   CHOL 109 08/29/2024 1505   TRIG 121 08/29/2024 1505   HDL 34 (L) 08/29/2024 1505   CHOLHDL 3.2 08/29/2024 1505   CHOLHDL 3.6 08/27/2022 0947   VLDL 13 08/27/2022 0947   LDLCALC 53 08/29/2024 1505     Risk Assessment/Calculations:       Physical Exam:    VS:  BP 118/68   Pulse 65   Ht 6' 2 (1.88 m)   Wt 209 lb 6.4 oz (95 kg)   SpO2 98%   BMI 26.89 kg/m     Wt Readings from Last 3 Encounters:  11/04/24 209 lb 6.4 oz (95 kg)  08/29/24 208 lb (94.3 kg)  03/14/24 209 lb 9.6 oz (95.1 kg)    Affect appropriate Chronically ill COPDer HEENT: normal Neck supple with no adenopathy JVP normal no bruits no thyromegaly Lungs clear with no wheezing and good diaphragmatic motion Heart:  S1/S2 no murmur, no rub, gallop or click PMI normal Abdomen: benighn, BS positve, no tenderness, no AAA no bruit.  No HSM or HJR Distal pulses intact with no bruits Bilateral below knee varicosities with plus one edema  Neuro non-focal Skin warm and dry No muscular weakness   EKG:  11/04/2024 SR LAD [id number]    Assessment and Plan:     Venous Insufficiency:  chronic discussed seeing Vein and vascular specialist for mapping and possible ablative procedure He is having pain and edema with standing    Claudication: sounds more neuropathic ABIs ordered in May 2023 not done good pulses on exam defer  CAD without angina: Moderate to moderately severe two-vessel coronary disease in the circumflex and LAD by cardiac catheterization 12/2019, medical management recommended.  He denies chest pain, dyspnea, or other symptoms concerning for angina.  Given ongoing smoking and known moderate CAD will order Ex Myovue  Hypertension: BP is well controlled today. No changes to  current medications.   Hyperlipidemia LDL  goal < 70:  LDL 53 Crestor   40 mg and Zetia     Tobacco abuse: Quit for 3 weeks but back at < 1 ppd  Lung fields ok on cancer screening CT 05/31/23  will update Needs to see ENT for hoarseness and r/o vocal cord issues Per primary may arrange sleep study   Lung cancer screening CT Ex Myovue  F/U in a year    Signed, Maude Emmer, MD  11/04/2024 8:17 AM    [facility]  "

## 2024-11-04 ENCOUNTER — Encounter: Payer: Self-pay | Admitting: Cardiovascular Disease

## 2024-11-04 ENCOUNTER — Ambulatory Visit: Attending: Cardiovascular Disease | Admitting: Cardiovascular Disease

## 2024-11-04 VITALS — BP 118/68 | HR 65 | Ht 74.0 in | Wt 209.4 lb

## 2024-11-04 DIAGNOSIS — I3139 Other pericardial effusion (noninflammatory): Secondary | ICD-10-CM

## 2024-11-04 DIAGNOSIS — E782 Mixed hyperlipidemia: Secondary | ICD-10-CM | POA: Diagnosis not present

## 2024-11-04 DIAGNOSIS — I8393 Asymptomatic varicose veins of bilateral lower extremities: Secondary | ICD-10-CM

## 2024-11-04 DIAGNOSIS — Z720 Tobacco use: Secondary | ICD-10-CM

## 2024-11-04 DIAGNOSIS — I10 Essential (primary) hypertension: Secondary | ICD-10-CM

## 2024-11-04 DIAGNOSIS — R0602 Shortness of breath: Secondary | ICD-10-CM | POA: Diagnosis not present

## 2024-11-04 NOTE — Patient Instructions (Addendum)
 Medication Instructions:  Your physician recommends that you continue on your current medications as directed. Please refer to the Current Medication list given to you today.  *If you need a refill on your cardiac medications before your next appointment, please call your pharmacy*  Testing/Procedures: Lung Screening CT   You are scheduled for a Myocardial Perfusion Study. Please arrive 15 minutes prior to your appointment time for registration and insurance purposes.  The test will take approximately 3-4 hours to complete; you may bring reading material.  If someone comes with you to your appointment, they will need to remain in the main lobby due to limited space in the testing area.  If you are pregnant or breastfeeding, please notify the nuclear lab prior to your appointment.  HOW TO PREPARE FOR YOUR TEST:  DO NOT eat or drink 3 hours prior to your test, except you may have water DO NOT consume products containing caffeine (regular or decaffeinated) 12 hours prior to your test (ex: coffee, chocolate, sodas, tea.) DO bring a list of your current medications with you.  If not listed below, you may take your medications as normal. DO NOT TAKE _________________ for 24 HOURS prior to the test.  Bring the medication to your appointment as you may be required to take it once the test is complete. DO wear comfortable clothes (no dresses or overalls) and walking shoes, tennis shoes preferred (No heels or open toe shoes are allowed) DO NOT wear cologne, perfume, aftershave, or lotions (deodorant is allowed). If these instructions are not followed, your test will have to be rescheduled.   Please report to [address] [place], [place] [address].  If you have questions or concerns about your appointment, you can call the Nuclear Lab [phone].  If you cannot keep your appointment, please provide 24 hours notification to the Nuclear Lab, to avoid a possible $50 charge to your account.    Follow-Up: At [facility], you and your health needs are our priority.  As part of our continuing mission to provide you with exceptional heart care, our providers are all part of one team.  This team includes your primary Cardiologist (physician) and Advanced Practice Providers or APPs (Physician Assistants and Nurse Practitioners) who all work together to provide you with the care you need, when you need it.  Your next appointment:   1 year(s)  Provider:   Maude Emmer, MD

## 2024-11-10 ENCOUNTER — Telehealth (HOSPITAL_COMMUNITY): Payer: Self-pay | Admitting: *Deleted

## 2024-11-10 ENCOUNTER — Other Ambulatory Visit: Payer: Self-pay

## 2024-11-10 ENCOUNTER — Other Ambulatory Visit: Payer: Self-pay | Admitting: Cardiovascular Disease

## 2024-11-10 DIAGNOSIS — R0602 Shortness of breath: Secondary | ICD-10-CM

## 2024-11-10 DIAGNOSIS — K219 Gastro-esophageal reflux disease without esophagitis: Secondary | ICD-10-CM

## 2024-11-10 NOTE — Telephone Encounter (Signed)
 Left message on voicemail per DPR in reference to upcoming appointment scheduled on 11/14/2024 at 8:00 with detailed instructions given per Myocardial Perfusion Study Information Sheet for the test. LM to arrive 15 minutes early, and that it is imperative to arrive on time for appointment to keep from having the test rescheduled. If you need to cancel or reschedule your appointment, please call the office within 24 hours of your appointment. Failure to do so may result in a cancellation of your appointment, and a $50 no show fee. Phone number given for call back for any questions.

## 2024-11-14 ENCOUNTER — Ambulatory Visit (HOSPITAL_COMMUNITY)
Admission: RE | Admit: 2024-11-14 | Discharge: 2024-11-14 | Disposition: A | Discharge status: Home / Self Care | Source: Ambulatory Visit | Attending: Internal Medicine | Admitting: Internal Medicine

## 2024-11-14 ENCOUNTER — Ambulatory Visit: Payer: Self-pay | Admitting: Cardiovascular Disease

## 2024-11-14 DIAGNOSIS — R0602 Shortness of breath: Secondary | ICD-10-CM | POA: Insufficient documentation

## 2024-11-14 LAB — MYOCARDIAL PERFUSION IMAGING
Estimated workload: 5.1
Exercise duration (min): 1 min
LV dias vol: 105 mL (ref 62–150)
LV sys vol: 23 mL
MPHR: 149 {beats}/min
Nuc Stress EF: 78 %
Peak HR: 94 {beats}/min
Percent HR: 63 %
RPE: 19
Rest HR: 59 {beats}/min
Rest Nuclear Isotope Dose: 10.3 mCi
SDS: 0
SRS: 1
SSS: 0
ST Depression (mm): 0 mm
Stress Nuclear Isotope Dose: 31.2 mCi
TID: 0.97

## 2024-11-14 MED ORDER — REGADENOSON 0.4 MG/5ML IV SOLN
INTRAVENOUS | Status: AC
  Filled 2024-11-14: qty 5

## 2024-11-14 MED ORDER — REGADENOSON 0.4 MG/5ML IV SOLN
0.4000 mg | Freq: Once | INTRAVENOUS | Status: AC
  Administered 2024-11-14: 0.4 mg via INTRAVENOUS

## 2024-11-14 MED ORDER — TECHNETIUM TC 99M TETROFOSMIN IV KIT
10.3000 | PACK | Freq: Once | INTRAVENOUS | Status: AC | PRN
  Administered 2024-11-14: 10.3 via INTRAVENOUS

## 2024-11-14 MED ORDER — TECHNETIUM TC 99M TETROFOSMIN IV KIT
31.2000 | PACK | Freq: Once | INTRAVENOUS | Status: AC | PRN
  Administered 2024-11-14: 31.2 via INTRAVENOUS

## 2024-11-20 ENCOUNTER — Ambulatory Visit: Payer: Self-pay | Admitting: Cardiovascular Disease

## 2025-02-27 ENCOUNTER — Ambulatory Visit

## 2025-03-13 ENCOUNTER — Ambulatory Visit
